# Patient Record
Sex: Female | Born: 1950 | Race: White | Hispanic: No | Smoking: Former smoker
Health system: Southern US, Community
[De-identification: ages and names within clinical notes are randomized; demographics above are authoritative.]

## PROBLEM LIST (undated history)

## (undated) DIAGNOSIS — R51 Headache: Secondary | ICD-10-CM

## (undated) DIAGNOSIS — R011 Cardiac murmur, unspecified: Secondary | ICD-10-CM

## (undated) DIAGNOSIS — F329 Major depressive disorder, single episode, unspecified: Secondary | ICD-10-CM

## (undated) DIAGNOSIS — K219 Gastro-esophageal reflux disease without esophagitis: Secondary | ICD-10-CM

## (undated) DIAGNOSIS — D649 Anemia, unspecified: Secondary | ICD-10-CM

## (undated) DIAGNOSIS — F419 Anxiety disorder, unspecified: Secondary | ICD-10-CM

## (undated) DIAGNOSIS — D259 Leiomyoma of uterus, unspecified: Secondary | ICD-10-CM

## (undated) DIAGNOSIS — I10 Essential (primary) hypertension: Secondary | ICD-10-CM

## (undated) DIAGNOSIS — F32A Depression, unspecified: Secondary | ICD-10-CM

## (undated) DIAGNOSIS — T4145XA Adverse effect of unspecified anesthetic, initial encounter: Secondary | ICD-10-CM

## (undated) DIAGNOSIS — M1990 Unspecified osteoarthritis, unspecified site: Secondary | ICD-10-CM

## (undated) DIAGNOSIS — T8859XA Other complications of anesthesia, initial encounter: Secondary | ICD-10-CM

## (undated) DIAGNOSIS — C4490 Unspecified malignant neoplasm of skin, unspecified: Secondary | ICD-10-CM

## (undated) DIAGNOSIS — R519 Headache, unspecified: Secondary | ICD-10-CM

## (undated) HISTORY — PX: BILATERAL OOPHORECTOMY: SHX1221

## (undated) HISTORY — PX: FACIAL COSMETIC SURGERY: SHX629

## (undated) HISTORY — PX: BACK SURGERY: SHX140

---

## 1972-01-08 HISTORY — PX: BILATERAL OOPHORECTOMY: SHX1221

## 2004-06-29 ENCOUNTER — Ambulatory Visit: Payer: Self-pay | Admitting: Obstetrics and Gynecology

## 2005-01-18 ENCOUNTER — Ambulatory Visit: Payer: Self-pay | Admitting: Internal Medicine

## 2006-01-23 ENCOUNTER — Ambulatory Visit: Payer: Self-pay | Admitting: Internal Medicine

## 2007-02-26 ENCOUNTER — Ambulatory Visit: Payer: Self-pay | Admitting: Internal Medicine

## 2007-12-14 ENCOUNTER — Ambulatory Visit: Payer: Self-pay | Admitting: Obstetrics and Gynecology

## 2008-03-24 ENCOUNTER — Ambulatory Visit: Payer: Self-pay | Admitting: Internal Medicine

## 2008-12-08 ENCOUNTER — Ambulatory Visit: Payer: Self-pay | Admitting: Internal Medicine

## 2009-01-07 HISTORY — PX: BREAST EXCISIONAL BIOPSY: SUR124

## 2009-03-31 ENCOUNTER — Ambulatory Visit: Payer: Self-pay | Admitting: Gastroenterology

## 2009-04-07 ENCOUNTER — Ambulatory Visit: Payer: Self-pay | Admitting: Gastroenterology

## 2009-04-21 ENCOUNTER — Ambulatory Visit: Payer: Self-pay | Admitting: Obstetrics and Gynecology

## 2009-11-09 ENCOUNTER — Ambulatory Visit: Payer: Self-pay | Admitting: Obstetrics and Gynecology

## 2010-01-16 ENCOUNTER — Ambulatory Visit: Payer: Self-pay

## 2010-01-22 ENCOUNTER — Ambulatory Visit: Payer: Self-pay | Admitting: Unknown Physician Specialty

## 2010-01-25 ENCOUNTER — Ambulatory Visit: Payer: Self-pay | Admitting: Unknown Physician Specialty

## 2010-05-31 ENCOUNTER — Ambulatory Visit: Payer: Self-pay | Admitting: Internal Medicine

## 2011-01-09 ENCOUNTER — Ambulatory Visit: Payer: Self-pay | Admitting: Unknown Physician Specialty

## 2011-06-04 ENCOUNTER — Ambulatory Visit: Payer: Self-pay | Admitting: Internal Medicine

## 2011-06-06 ENCOUNTER — Ambulatory Visit: Payer: Self-pay | Admitting: Internal Medicine

## 2012-07-14 ENCOUNTER — Ambulatory Visit: Payer: Self-pay | Admitting: Internal Medicine

## 2012-07-16 ENCOUNTER — Ambulatory Visit: Payer: Self-pay | Admitting: Internal Medicine

## 2012-07-29 ENCOUNTER — Ambulatory Visit: Payer: Self-pay | Admitting: Obstetrics and Gynecology

## 2012-07-29 LAB — BASIC METABOLIC PANEL
Anion Gap: 4 — ABNORMAL LOW (ref 7–16)
BUN: 15 mg/dL (ref 7–18)
Calcium, Total: 8.9 mg/dL (ref 8.5–10.1)
EGFR (Non-African Amer.): >60
Potassium: 3.8 mmol/L (ref 3.5–5.1)

## 2012-07-29 LAB — HEMOGLOBIN: HGB: 12.9 g/dL (ref 12.0–16.0)

## 2012-08-03 ENCOUNTER — Ambulatory Visit: Payer: Self-pay | Admitting: Obstetrics and Gynecology

## 2012-08-04 LAB — PATHOLOGY REPORT

## 2013-08-16 ENCOUNTER — Ambulatory Visit: Payer: Self-pay | Admitting: Internal Medicine

## 2014-04-29 NOTE — Op Note (Signed)
PATIENT NAME:  TIANI, STANBERY MR#:  [id number] DATE OF BIRTH:  1950-07-06  DATE OF PROCEDURE:  08/03/2012  PREOPERATIVE DIAGNOSIS:  1.  Postmenopausal bleeding.  2.  Large submucosal endometrial fibroid.   POSTOPERATIVE DIAGNOSIS:  1.  Postmenopausal bleeding.  2.  Large submucosal endometrial fibroid.   PROCEDURE: Resectoscopic removal of endometrial submucosal fibroid.   SURGEON: Boykin Nearing, M.D.   ANESTHESIA: General endotracheal anesthesia.  FIRST ASSISTANT: None.   INDICATION: This is a 64 year old, gravida 1, para 1 patient with postmenopausal bleeding. Saline infusion sonohysterography revealed a 3.6 x 2.4 x 3.6 cm endometrial fibroid. Endometrial biopsy performed in the office was negative for hyperplasia or cancer.   DESCRIPTION OF PROCEDURE: After adequate general endotracheal anesthesia, the patient was placed in the dorsal supine position with the legs in the candy cane stirrups. The patient did receive 2 grams IV cefoxitin prior to commencement of the case. Lower abdominal and vaginal perineal prep performed with Betadine. The patient's bladder was catheterized yielding 50 mL clear urine. Weighted speculum was placed into the posterior vaginal vault and the anterior cervix was grasped with a single-tooth tenaculum. Cervix was then dilated to #18 Hanks dilator without difficulty. The hysteroscope was then advanced into the endometrial cavity without difficulty. Evaluation of the endometrial cavity revealed a large fibroid at the 9 to 12 o'clock position. The hysteroscope was removed and the cervix was then dilated to a #20 Hanks dilator and the resectoscope was assembled and then advanced into the endometrial cavity, again with 1.5% Glycine used as the distending medium. The resectoscope was used to remove the submucosal fibroid. Multiple bits and pieces were retrieved. The fibroid was removed in its almost entirety. There was no bleeding from the fibroid bed. The  remnants of fiber were removed with Randall stone forceps. Pictures were taken. There were no complications. 3400 mL of 1.5% Glycine used with a net deficit of 330 mL. Some of this deficit was due to spillage onto the draping and to the floor. Estimated blood loss 10 mL. Intraoperative fluids 600 mL. The patient tolerated the procedure well and was taken to the recovery room in good condition.  ____________________________ Boykin Nearing, MD tjs:aw D: 08/03/2012 10:35:10 ET T: 08/03/2012 10:45:49 ET JOB#: [id number]  cc: Boykin Nearing, MD, <Dictator> Boykin Nearing MD ELECTRONICALLY SIGNED 08/07/2012 8:32

## 2014-08-11 ENCOUNTER — Other Ambulatory Visit: Payer: Self-pay | Admitting: Internal Medicine

## 2014-08-11 DIAGNOSIS — Z1231 Encounter for screening mammogram for malignant neoplasm of breast: Secondary | ICD-10-CM

## 2014-08-23 ENCOUNTER — Other Ambulatory Visit: Payer: Self-pay | Admitting: Internal Medicine

## 2014-08-23 DIAGNOSIS — Z1231 Encounter for screening mammogram for malignant neoplasm of breast: Secondary | ICD-10-CM

## 2014-08-24 ENCOUNTER — Ambulatory Visit
Admission: RE | Admit: 2014-08-24 | Discharge: 2014-08-24 | Disposition: A | Discharge status: Home / Self Care | Payer: BC Managed Care – PPO | Source: Ambulatory Visit | Attending: Internal Medicine | Admitting: Internal Medicine

## 2014-08-24 DIAGNOSIS — Z1231 Encounter for screening mammogram for malignant neoplasm of breast: Secondary | ICD-10-CM | POA: Insufficient documentation

## 2014-08-24 HISTORY — DX: Unspecified malignant neoplasm of skin, unspecified: C44.90

## 2015-05-04 ENCOUNTER — Other Ambulatory Visit: Payer: Self-pay | Admitting: Internal Medicine

## 2015-05-04 DIAGNOSIS — N6320 Unspecified lump in the left breast, unspecified quadrant: Secondary | ICD-10-CM

## 2015-05-24 ENCOUNTER — Ambulatory Visit
Admission: RE | Admit: 2015-05-24 | Discharge: 2015-05-24 | Disposition: A | Discharge status: Home / Self Care | Payer: Medicare Other | Source: Ambulatory Visit | Attending: Internal Medicine | Admitting: Internal Medicine

## 2015-05-24 ENCOUNTER — Other Ambulatory Visit: Payer: Self-pay | Admitting: Internal Medicine

## 2015-05-24 DIAGNOSIS — N6320 Unspecified lump in the left breast, unspecified quadrant: Secondary | ICD-10-CM

## 2015-05-24 DIAGNOSIS — N63 Unspecified lump in breast: Secondary | ICD-10-CM | POA: Diagnosis not present

## 2015-05-26 ENCOUNTER — Other Ambulatory Visit: Payer: Self-pay | Admitting: Internal Medicine

## 2015-05-26 DIAGNOSIS — N6320 Unspecified lump in the left breast, unspecified quadrant: Secondary | ICD-10-CM

## 2015-08-31 ENCOUNTER — Ambulatory Visit
Admission: RE | Admit: 2015-08-31 | Discharge: 2015-08-31 | Disposition: A | Discharge status: Home / Self Care | Payer: Medicare Other | Source: Ambulatory Visit | Attending: Internal Medicine | Admitting: Internal Medicine

## 2015-08-31 DIAGNOSIS — R928 Other abnormal and inconclusive findings on diagnostic imaging of breast: Secondary | ICD-10-CM | POA: Insufficient documentation

## 2015-08-31 DIAGNOSIS — N6320 Unspecified lump in the left breast, unspecified quadrant: Secondary | ICD-10-CM

## 2015-08-31 DIAGNOSIS — N63 Unspecified lump in breast: Secondary | ICD-10-CM | POA: Diagnosis present

## 2016-02-25 ENCOUNTER — Emergency Department: Payer: Medicare Other

## 2016-02-25 ENCOUNTER — Inpatient Hospital Stay
Admission: EM | Admit: 2016-02-25 | Discharge: 2016-02-28 | DRG: 494 | Disposition: A | Discharge status: Home Health | Payer: Medicare Other | Attending: Orthopedic Surgery | Admitting: Orthopedic Surgery

## 2016-02-25 ENCOUNTER — Encounter: Payer: Self-pay | Admitting: Emergency Medicine

## 2016-02-25 DIAGNOSIS — S82852A Displaced trimalleolar fracture of left lower leg, initial encounter for closed fracture: Secondary | ICD-10-CM | POA: Diagnosis present

## 2016-02-25 DIAGNOSIS — Z9889 Other specified postprocedural states: Secondary | ICD-10-CM

## 2016-02-25 DIAGNOSIS — W109XXA Fall (on) (from) unspecified stairs and steps, initial encounter: Secondary | ICD-10-CM | POA: Diagnosis present

## 2016-02-25 DIAGNOSIS — F329 Major depressive disorder, single episode, unspecified: Secondary | ICD-10-CM | POA: Diagnosis present

## 2016-02-25 DIAGNOSIS — Z7951 Long term (current) use of inhaled steroids: Secondary | ICD-10-CM

## 2016-02-25 DIAGNOSIS — Z881 Allergy status to other antibiotic agents status: Secondary | ICD-10-CM | POA: Diagnosis not present

## 2016-02-25 DIAGNOSIS — E7800 Pure hypercholesterolemia, unspecified: Secondary | ICD-10-CM | POA: Diagnosis present

## 2016-02-25 DIAGNOSIS — Z85828 Personal history of other malignant neoplasm of skin: Secondary | ICD-10-CM

## 2016-02-25 DIAGNOSIS — E785 Hyperlipidemia, unspecified: Secondary | ICD-10-CM | POA: Diagnosis present

## 2016-02-25 DIAGNOSIS — M25572 Pain in left ankle and joints of left foot: Secondary | ICD-10-CM | POA: Diagnosis present

## 2016-02-25 DIAGNOSIS — Z87891 Personal history of nicotine dependence: Secondary | ICD-10-CM | POA: Diagnosis not present

## 2016-02-25 DIAGNOSIS — Z01811 Encounter for preprocedural respiratory examination: Secondary | ICD-10-CM

## 2016-02-25 DIAGNOSIS — E876 Hypokalemia: Secondary | ICD-10-CM | POA: Diagnosis not present

## 2016-02-25 DIAGNOSIS — Z8781 Personal history of (healed) traumatic fracture: Secondary | ICD-10-CM

## 2016-02-25 DIAGNOSIS — F419 Anxiety disorder, unspecified: Secondary | ICD-10-CM | POA: Diagnosis present

## 2016-02-25 DIAGNOSIS — S82892A Other fracture of left lower leg, initial encounter for closed fracture: Secondary | ICD-10-CM | POA: Diagnosis present

## 2016-02-25 HISTORY — DX: Major depressive disorder, single episode, unspecified: F32.9

## 2016-02-25 HISTORY — DX: Depression, unspecified: F32.A

## 2016-02-25 LAB — COMPREHENSIVE METABOLIC PANEL
ALT: 13 U/L — ABNORMAL LOW (ref 14–54)
ANION GAP: 7 (ref 5–15)
AST: 19 U/L (ref 15–41)
Albumin: 4.5 g/dL (ref 3.5–5.0)
Alkaline Phosphatase: 55 U/L (ref 38–126)
BUN: 11 mg/dL (ref 6–20)
CHLORIDE: 104 mmol/L (ref 101–111)
CO2: 29 mmol/L (ref 22–32)
Calcium: 9 mg/dL (ref 8.9–10.3)
Creatinine, Ser: 0.8 mg/dL (ref 0.44–1.00)
Glucose, Bld: 92 mg/dL (ref 65–99)
POTASSIUM: 3.9 mmol/L (ref 3.5–5.1)
Sodium: 140 mmol/L (ref 135–145)
TOTAL PROTEIN: 7.2 g/dL (ref 6.5–8.1)
Total Bilirubin: 0.6 mg/dL (ref 0.3–1.2)

## 2016-02-25 LAB — CBC WITH DIFFERENTIAL/PLATELET
BASOS ABS: 0.1 10*3/uL (ref 0–0.1)
Basophils Relative: 1 %
EOS PCT: 0 %
Eosinophils Absolute: 0 10*3/uL (ref 0–0.7)
HCT: 43.1 % (ref 35.0–47.0)
Hemoglobin: 14.3 g/dL (ref 12.0–16.0)
LYMPHS PCT: 12 %
Lymphs Abs: 1.1 10*3/uL (ref 1.0–3.6)
MCH: 31.1 pg (ref 26.0–34.0)
MCHC: 33.2 g/dL (ref 32.0–36.0)
MCV: 93.7 fL (ref 80.0–100.0)
MONO ABS: 0.4 10*3/uL (ref 0.2–0.9)
Monocytes Relative: 5 %
Neutro Abs: 7.5 10*3/uL — ABNORMAL HIGH (ref 1.4–6.5)
Neutrophils Relative %: 82 %
PLATELETS: 279 10*3/uL (ref 150–440)
RBC: 4.6 MIL/uL (ref 3.80–5.20)
RDW: 13.2 % (ref 11.5–14.5)
WBC: 9.1 10*3/uL (ref 3.6–11.0)

## 2016-02-25 LAB — APTT: APTT: 27 s (ref 24–36)

## 2016-02-25 LAB — TYPE AND SCREEN
ABO/RH(D): A POS
ANTIBODY SCREEN: NEGATIVE

## 2016-02-25 LAB — PROTIME-INR
INR: 0.96
Prothrombin Time: 12.8 seconds (ref 11.4–15.2)

## 2016-02-25 MED ORDER — MORPHINE SULFATE (PF) 2 MG/ML IV SOLN
INTRAVENOUS | Status: AC
  Filled 2016-02-25: qty 1

## 2016-02-25 MED ORDER — METHOCARBAMOL 500 MG PO TABS: 500.0000 mg | ORAL_TABLET | Freq: Four times a day (QID) | ORAL | Status: DC | PRN

## 2016-02-25 MED ORDER — ONDANSETRON HCL 4 MG/2ML IJ SOLN
4.0000 mg | Freq: Once | INTRAMUSCULAR | Status: AC
  Administered 2016-02-25: 4 mg via INTRAVENOUS

## 2016-02-25 MED ORDER — OXYCODONE-ACETAMINOPHEN 5-325 MG PO TABS: 1.0000 | ORAL_TABLET | Freq: Once | ORAL | Status: DC

## 2016-02-25 MED ORDER — ONDANSETRON HCL 4 MG/2ML IJ SOLN
INTRAMUSCULAR | Status: AC
  Administered 2016-02-25: 4 mg via INTRAVENOUS
  Filled 2016-02-25: qty 2

## 2016-02-25 MED ORDER — MORPHINE SULFATE (PF) 2 MG/ML IV SOLN
2.0000 mg | Freq: Once | INTRAVENOUS | Status: AC
  Administered 2016-02-25: 2 mg via INTRAVENOUS

## 2016-02-25 MED ORDER — BUPIVACAINE HCL 0.25 % IJ SOLN
30.0000 mL | Freq: Once | INTRAMUSCULAR | Status: AC
  Administered 2016-02-25: 30 mL
  Filled 2016-02-25: qty 30

## 2016-02-25 MED ORDER — MORPHINE SULFATE (PF) 2 MG/ML IV SOLN
2.0000 mg | INTRAVENOUS | Status: DC | PRN
  Administered 2016-02-26 (×4): 2 mg via INTRAVENOUS
  Filled 2016-02-25 (×4): qty 1

## 2016-02-25 MED ORDER — METHOCARBAMOL 1000 MG/10ML IJ SOLN
500.0000 mg | Freq: Four times a day (QID) | INTRAVENOUS | Status: DC | PRN
  Filled 2016-02-25: qty 5

## 2016-02-25 MED ORDER — LIDOCAINE HCL 1 % IJ SOLN
10.0000 mL | Freq: Once | INTRAMUSCULAR | Status: AC
  Administered 2016-02-25: 10 mL
  Filled 2016-02-25: qty 10

## 2016-02-25 MED ORDER — SODIUM CHLORIDE 0.9 % IV SOLN
INTRAVENOUS | Status: DC
  Administered 2016-02-26: via INTRAVENOUS

## 2016-02-25 MED ORDER — BUPIVACAINE HCL (PF) 0.25 % IJ SOLN
INTRAMUSCULAR | Status: AC
  Administered 2016-02-25: 30 mL
  Filled 2016-02-25: qty 30

## 2016-02-25 MED ORDER — MORPHINE SULFATE (PF) 2 MG/ML IV SOLN: 2.0000 mg | Freq: Once | INTRAVENOUS | Status: DC

## 2016-02-25 MED ORDER — LIDOCAINE HCL (PF) 1 % IJ SOLN
INTRAMUSCULAR | Status: AC
  Administered 2016-02-25: 10 mL
  Filled 2016-02-25: qty 10

## 2016-02-25 MED ORDER — MORPHINE SULFATE (PF) 2 MG/ML IV SOLN
INTRAVENOUS | Status: AC
  Administered 2016-02-25: 2 mg via INTRAVENOUS
  Filled 2016-02-25: qty 1

## 2016-02-25 MED ORDER — OXYCODONE HCL 5 MG PO TABS: 5.0000 mg | ORAL_TABLET | ORAL | Status: DC | PRN

## 2016-02-25 NOTE — ED Provider Notes (Signed)
[facility] Emergency Department Provider Note  ____________________________________________  Time seen: Approximately 5:54 PM  I have reviewed the triage vital signs and the nursing notes.   HISTORY  Chief Complaint Ankle Pain and Fall    HPI Margaret Warner is a 66 y.o. female presenting to the emergency department with 10/10 aching/throbbing left ankle pain after falling while descending two stairs at her home. Patient states that her left foot inverted and "became caught" on the last step. Patient denies hitting her head or loss of consciousness during fall. She denies prior traumas or surgeries to the left lower extremity. Patient has been unable to bear weight on her left lower extremity since the incident. Patient denies chest pain, chest tightness, shortness of breath, abdominal pain, nausea and vomiting. No alleviating measures have been attempted.     Past Medical History:  Diagnosis Date  . Depression   . Skin cancer     Patient Active Problem List   Diagnosis Date Noted  . Fracture dislocation of ankle joint, left, closed, initial encounter 02/25/2016    Past Surgical History:  Procedure Laterality Date  . BACK SURGERY    . BREAST BIOPSY Left 2011   EXCISIONAL - NEG    Prior to Admission medications   Medication Sig Start Date End Date Taking? Authorizing Provider  ALPRAZolam Duanne Moron) 0.5 MG tablet Take 0.5 mg by mouth at bedtime as needed for anxiety.   Yes Historical Provider, MD  buPROPion (WELLBUTRIN XL) 300 MG 24 hr tablet Take 1 tablet by mouth daily. 01/28/16  Yes Historical Provider, MD  butalbital-acetaminophen-caffeine (FIORICET, ESGIC) 50-325-40 MG tablet Take 1 tablet by mouth as needed. 02/05/16  Yes Historical Provider, MD  COMBIPATCH 0.05-0.14 MG/DAY Place 1 patch onto the skin 2 (two) times a week.  02/04/16  Yes Historical Provider, MD  DULoxetine (CYMBALTA) 30 MG capsule Take 1 capsule by mouth daily. 12/14/15  Yes  Historical Provider, MD  fluticasone (FLONASE) 50 MCG/ACT nasal spray Place 2 sprays into both nostrils daily. 02/22/16  Yes Historical Provider, MD  gabapentin (NEURONTIN) 300 MG capsule Take 1 capsule by mouth 2 (two) times daily. 02/22/16  Yes Historical Provider, MD  HYDROcodone-acetaminophen (NORCO/VICODIN) 5-325 MG tablet Take 1 tablet by mouth every 4 (four) hours as needed. 02/23/16  Yes Historical Provider, MD  rosuvastatin (CRESTOR) 10 MG tablet Take 1 tablet by mouth daily. 12/25/15  Yes Historical Provider, MD  tiZANidine (ZANAFLEX) 4 MG tablet Take 1 tablet by mouth 3 (three) times daily. 02/22/16  Yes Historical Provider, MD  valACYclovir (VALTREX) 1000 MG tablet Take 1 tablet by mouth 3 (three) times daily. 12/25/15  Yes Historical Provider, MD    Allergies Tetracyclines & related  Family History  Problem Relation Age of Onset  . Breast cancer Neg Hx     Social History Social History  Substance Use Topics  . Smoking status: Former Smoker    Packs/day: 2.00    Years: 15.00    Types: Cigarettes    Quit date: 02/25/2004  . Smokeless tobacco: Never Used  . Alcohol use Yes     Comment: 1 drink weekly     Review of Systems  Constitutional: No fever/chills Eyes: No visual changes. No discharge ENT: No upper respiratory complaints. Cardiovascular: no chest pain. Respiratory: no cough. No SOB. Gastrointestinal: No abdominal pain. No nausea, no vomiting.  No diarrhea.  No constipation. Musculoskeletal: Patient has left ankle pain.  Skin: Negative for rash, abrasions, lacerations, ecchymosis. Neurological: Negative for  headaches, focal weakness or numbness. ___________________________________________   PHYSICAL EXAM:  VITAL SIGNS: ED Triage Vitals [02/25/16 1704]  Enc Vitals Group     BP (!) 145/87     Pulse Rate 64     Resp 20     Temp 97.7 F (36.5 C)     Temp Source Oral     SpO2 100 %     Weight 135 lb (61.2 kg)     Height 5\' 3"  (1.6 m)     Head  Circumference      Peak Flow      Pain Score 9     Pain Loc      Pain Edu?      Excl. in [place]?      Constitutional: Alert and oriented. Well appearing and in no acute distress. Hematological/Lymphatic/Immunilogical: No cervical lymphadenopathy. Cardiovascular: Normal rate, regular rhythm. Normal S1 and S2.  Good peripheral circulation. Respiratory: Normal respiratory effort without tachypnea or retractions. Lungs CTAB. Good air entry to the bases with no decreased or absent breath sounds. Musculoskeletal: Patient has 5 out of 5 strength in the lower extremities bilaterally. To inspection, left ankle appears in malalignment. Patient is unable to perform dorsiflexion or plantar flexion. She is able to move all five toes, left. Palpable dorsalis pedis pulse bilaterally and symmetrically. Neurologic:  Normal speech and language. No gross focal neurologic deficits are appreciated. Reflexes are 2+ and symmetric in the lower extremities bilaterally. Skin:  Patient has ecchymosis visualized at left ankle. Psychiatric: Mood and affect are normal. Speech and behavior are normal. Patient exhibits appropriate insight and judgement.   ____________________________________________   LABS (all labs ordered are listed, but only abnormal results are displayed)  Labs Reviewed  CBC WITH DIFFERENTIAL/PLATELET - Abnormal; Notable for the following:       Result Value   Neutro Abs 7.5 (*)    All other components within normal limits  COMPREHENSIVE METABOLIC PANEL - Abnormal; Notable for the following:    ALT 13 (*)    All other components within normal limits  URINE CULTURE  SURGICAL PCR SCREEN  PROTIME-INR  APTT  CBC  BASIC METABOLIC PANEL  TYPE AND SCREEN   ____________________________________________  EKG  Normal sinus rhythm without ST segment elevation. ____________________________________________  Hallock, personally viewed and evaluated these images (plain  radiographs) as part of my medical decision making, as well as reviewing the written report by the radiologist.  Dg Ankle Complete Left  Result Date: 02/25/2016 CLINICAL DATA:  Trimalleolar ankle fracture. EXAM: LEFT ANKLE COMPLETE - 3+ VIEW COMPARISON:  Earlier the same day. FINDINGS: Posterior dislocation of the talus is been reduced in the interval. There is persistent lateral subluxation of the talar dome relative to the distal tibia. Comminuted distal fibula fracture noted with medial malleolus fracture the distal tibia and posterior lip distal tibia fracture associated. IMPRESSION: Interval reduction of tibiotalar dislocation although lateral subluxation of the talus relative to the tibia persists. Electronically Signed   By: Misty Stanley M.D.   On: 02/25/2016 21:50   Dg Ankle Complete Left  Result Date: 02/25/2016 CLINICAL DATA:  Status post attempted reduction of left trimalleolar ankle fracture/dislocation. Initial encounter. EXAM: LEFT ANKLE COMPLETE - 3+ VIEW COMPARISON:  None. FINDINGS: There is improved alignment of the comminuted trimalleolar fracture of the left ankle. However, there is persistent posterior dislocation of the talus, lodged against the posterior malleolar fracture site. Significantly posteriorly displaced distal fibular and posterior malleolar fragments are again  noted, with a mildly displaced medial malleolar fracture. No new fractures are seen. Surrounding soft tissue swelling is noted. The subtalar joint is grossly unremarkable. IMPRESSION: Persistent posterior dislocation of the talus, lodged against the posterior malleolar fracture site. Significantly posteriorly displaced distal fibular and posterior malleolar fragments again noted, with a mildly displaced medial malleolar fracture. Alignment is somewhat improved from the prior study. These results were called by telephone at the time of interpretation on 02/25/2016 at 8:05 pm to Nursing at the [facility] ER, who  verbally acknowledged these results. Electronically Signed   By: Garald Balding M.D.   On: 02/25/2016 20:05   Dg Ankle Complete Left  Result Date: 02/25/2016 CLINICAL DATA:  Patient fell down stairs. Left ankle pain and deformity. EXAM: LEFT ANKLE COMPLETE - 3+ VIEW COMPARISON:  None. FINDINGS: Comminuted distal fibular fracture is associated with posterior lip fracture of the distal tibia and medial malleolar fracture. There is medial dislocation of the distal tibia relative to the talar dome. IMPRESSION: Comminuted trimalleolar fracture dislocation. Electronically Signed   By: Misty Stanley M.D.   On: 02/25/2016 17:28   Ct Ankle Left Wo Contrast  Result Date: 02/25/2016 CLINICAL DATA:  66 year old female with left ankle fracture dislocation. EXAM: CT OF THE LEFT ANKLE WITHOUT CONTRAST TECHNIQUE: Multidetector CT imaging of the left ankle was performed according to the standard protocol. Multiplanar CT image reconstructions were also generated. COMPARISON:  Left ankle radiographs dated 02/25/2016 FINDINGS: Bones/Joint/Cartilage There is a displaced and comminuted fracture of the distal fibula. There is extension of the fracture fragment into the lateral tibiotalar articulation. There is displaced corner fracture of the lateral aspect of the distal tibia at the talotibial articulation. There is fracture of the medial malleolus with approximately 5 mm lateral displacement of the distal fracture fragment. There is mildly displaced comminuted fracture of the posterior malleolus. No other acute fracture identified. The bones are osteopenic. There is approximately 5 mm lateral subluxation of the talus in relation to the tibia. Ligaments Suboptimally assessed by CT. Muscles and Tendons No significant intramuscular hematoma. No definite tendon injury by CT. The Achilles tendon is intact. Soft tissues There is diffuse subcutaneous soft tissue edema as well as edema and contusion of the deeper fascia layer. A 15 mm  rounded low attenuating intermuscular focus in the posterior compartment of the distal leg demonstrates fat attenuation. IMPRESSION: 1. Comminuted and displaced fracture of the distal fibula as well as fractures of the posterior and medial malleoli. There is also mildly displaced corner fracture of the lateral distal tibia at the talotibial articulation. 2. Approximately 5 mm lateral subluxation of the talus in relation to the tibia. Electronically Signed   By: Anner Crete M.D.   On: 02/25/2016 22:57   Ct 3d Recon At Scanner  Result Date: 02/25/2016 CLINICAL DATA:  66 year old female with left ankle fracture dislocation. EXAM: CT OF THE LEFT ANKLE WITHOUT CONTRAST TECHNIQUE: Multidetector CT imaging of the left ankle was performed according to the standard protocol. Multiplanar CT image reconstructions were also generated. COMPARISON:  Left ankle radiographs dated 02/25/2016 FINDINGS: Bones/Joint/Cartilage There is a displaced and comminuted fracture of the distal fibula. There is extension of the fracture fragment into the lateral tibiotalar articulation. There is displaced corner fracture of the lateral aspect of the distal tibia at the talotibial articulation. There is fracture of the medial malleolus with approximately 5 mm lateral displacement of the distal fracture fragment. There is mildly displaced comminuted fracture of the posterior malleolus. No other  acute fracture identified. The bones are osteopenic. There is approximately 5 mm lateral subluxation of the talus in relation to the tibia. Ligaments Suboptimally assessed by CT. Muscles and Tendons No significant intramuscular hematoma. No definite tendon injury by CT. The Achilles tendon is intact. Soft tissues There is diffuse subcutaneous soft tissue edema as well as edema and contusion of the deeper fascia layer. A 15 mm rounded low attenuating intermuscular focus in the posterior compartment of the distal leg demonstrates fat attenuation.  IMPRESSION: 1. Comminuted and displaced fracture of the distal fibula as well as fractures of the posterior and medial malleoli. There is also mildly displaced corner fracture of the lateral distal tibia at the talotibial articulation. 2. Approximately 5 mm lateral subluxation of the talus in relation to the tibia. Electronically Signed   By: Anner Crete M.D.   On: 02/25/2016 22:57    ____________________________________________    PROCEDURES  Procedure(s) performed:    Procedures    Medications  0.9 %  sodium chloride infusion ( Intravenous New Bag/Given 02/26/16 0016)  morphine 2 MG/ML injection 2 mg (not administered)  methocarbamol (ROBAXIN) tablet 500 mg (not administered)    Or  methocarbamol (ROBAXIN) 500 mg in dextrose 5 % 50 mL IVPB (not administered)  oxyCODONE (Oxy IR/ROXICODONE) immediate release tablet 5-10 mg (not administered)  ondansetron (ZOFRAN) injection 4 mg (4 mg Intravenous Given 02/25/16 1729)  morphine 2 MG/ML injection 2 mg (2 mg Intravenous Given 02/25/16 1729)  lidocaine (XYLOCAINE) 1 % (with pres) injection 10 mL (10 mLs Infiltration Given by Other 02/25/16 1952)  bupivacaine (MARCAINE) 0.25 % (with pres) injection 30 mL (30 mLs Infiltration Given by Other 02/25/16 1951)  morphine 2 MG/ML injection 2 mg (2 mg Intravenous Given 02/25/16 1813)  morphine 2 MG/ML injection 2 mg (2 mg Intravenous Given 02/25/16 2314)     ____________________________________________   INITIAL IMPRESSION / ASSESSMENT AND PLAN / ED COURSE  Pertinent labs & imaging results that were available during my care of the patient were reviewed by me and considered in my medical decision making (see chart for details).  Review of the [place] CSRS was performed in accordance of the [place] prior to dispensing any controlled drugs.     Assessment and Plan:  Closed fracture of left ankle, initial encounter.  Patient presents to the emergency department with left ankle pain after falling  down 2 stairs at her home. DG left ankle reveals a comminuted distal fibular fracture, with posterior lip fracture of the distal tibia and medial malleolar fracture. In addition, patient had trimalleolar fracture dislocation. Dr. Mack Guise, the orthopedist on-call, was consulted. Dr. Mack Guise performed patient's left ankle reduction in the emergency department. Patient's care was assumed by Dr. Mack Guise for transfer to ortho service, likely admission and surgical planning.     ____________________________________________  FINAL CLINICAL IMPRESSION(S) / ED DIAGNOSES  Final diagnoses:  Closed fracture of left ankle, initial encounter      NEW MEDICATIONS STARTED DURING THIS VISIT:  Current Discharge Medication List          This chart was dictated using voice recognition software/Dragon. Despite best efforts to proofread, errors can occur which can change the meaning. Any change was purely unintentional.    Lannie Fields, PA-C 02/26/16 [id number]    Nance Pear, MD 02/27/16 1154

## 2016-02-25 NOTE — ED Triage Notes (Signed)
Brought in via ems s/p fall  States she fell down 2 steps  Twisted left ankle  Positive deformity  Positive pulses

## 2016-02-25 NOTE — ED Provider Notes (Signed)
Apolonio Schneiders, attending physician, personally viewed and interpreted this EKG  EKG Time: 1944 Rate: 72 Rhythm: normal sinus rhythm Axis: normal Intervals: qtc 457 QRS: narrow ST changes: no st elevation, t wave inversion v1-v3 Impression: abnormal ekg    Nance Pear, MD 02/25/16 1946

## 2016-02-25 NOTE — H&P (Signed)
PREOPERATIVE H&P  Chief Complaint: Left ankle pain status post fall  HPI: Margaret Warner is a 66 y.o. female who presents to the [facility] regional emergency Department after sustaining an injury to her left ankle. 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 She was walking down a set of stairs.  Her right foot got caught on the stairs causing her left ankle to invert. She felt a pop and was unable to stand or bear weight following this injury.  She states that she has hypercholesterolemia which is being treated with statins by her primary care physician, Dr. Doy Hutching.  He is seen in emergency department with her friend, Ms. Baker, at the bedside.  Past Medical History:  Diagnosis Date  . Depression   . Skin cancer    Past Surgical History:  Procedure Laterality Date  . BREAST BIOPSY Left 2011   EXCISIONAL - NEG   Social History   Social History  . Marital status: Divorced    Spouse name: N/A  . Number of children: N/A  . Years of education: N/A   Social History Main Topics  . Smoking status: Former Research scientist (life sciences)  . Smokeless tobacco: Never Used  . Alcohol use Yes     Comment: 1 drink weekly  . Drug use: Unknown  . Sexual activity: Not Asked   Other Topics Concern  . None   Social History Narrative  . None   Family History  Problem Relation Age of Onset  . Breast cancer Neg Hx    Allergies  Allergen Reactions  . Tetracyclines & Related Rash   Prior to Admission medications   Medication Sig Start Date End Date Taking? Authorizing Provider  ALPRAZolam Duanne Moron) 0.5 MG tablet Take 0.5 mg by mouth at bedtime as needed for anxiety.   Yes Historical Provider, MD     Positive ROS: All other systems have been reviewed and were otherwise negative with the exception of those mentioned in the HPI and as above.  Physical Exam: General: Alert, no acute distress Cardiovascular: Regular rate and rhythm, no murmurs rubs or gallops.  No  pedal edema Respiratory: Clear to auscultation bilaterally, no wheezes rales or rhonchi. No cyanosis, no use of accessory musculature GI: No organomegaly, abdomen is soft and non-tender nondistended with positive bowel sounds. Skin: Skin intact, no lesions within the operative field. Neurologic: Sensation intact distally Psychiatric: Patient is competent for consent with normal mood and affect Lymphatic: No cervical lymphadenopathy  MUSCULOSKELETAL: Left ankle: Upon initial evaluation the patient's skin overlying the left ankle is intact. She has a valgus deformity of the left ankle. Foot is well perfused and she has palpable pedal pulses. She has intact sensation light touch throughout the left lower extremity. Her foot and ankle compartment are soft and compressible. She flex and extend her toes.  Assessment: Left ankle fracture dislocation  Plan: I explained to the patient that she has sustained a left ankle fracture dislocation.  I recommended a closed reduction medially in the ER. She agreed.  Patient was given an ankle block under sterile conditions with 10 cc of 1% lidocaine plain and 10 cc of half percent Marcaine plain. Injection was given over the anteromedial ankle. She tolerated this injection well. After slight 5 minutes, the ankle was gently reduced. She was placed in an AO splint. Her toes remain well perfused. She had intact sensation light touch in all 5 toes and can flex and extend her toes after the  reduction. Post reduction x-rays were ordered.  Splint to the patient that she will require surgical fixation for her injury. Her injury is scheduled for tomorrow. She will be cleared by the hospitalist service for surgery. I have placed orders for admission to the orthopedic service. Patient be nothing by mouth after midnight. Preoperative labs have been ordered.  I explained the details of the operation as well as the postoperative course. She understands that she'll be  nonweightbearing on the left lower extremity for 6-8 weeks postop. 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  I discussed the risks and benefits of surgery. The patient understood the risks include but are not limited to infection, bleeding, nerve or blood vessel injury especially injury to the superficial peroneal nerve leading to dorsal foot numbness, joint stiffness, osteoarthritis or loss of motion, persistent pain, weakness or instability, malunion, nonunion and hardware failure and the need for further surgery. Medical risks include but are not limited to DVT and pulmonary embolism, myocardial infarction, stroke, pneumonia, respiratory failure and death. Patient understood these risks and wished to proceed.   Patient will be added on to the surgical schedule for tomorrow. Surgery is pending medical clearance. I answered all the patient's questions.  Thornton Park, MD   02/25/2016 7:46 PM

## 2016-02-25 NOTE — ED Notes (Signed)
Pt assisted with bed pan by this tech

## 2016-02-26 ENCOUNTER — Inpatient Hospital Stay: Payer: Medicare Other | Admitting: Anesthesiology

## 2016-02-26 ENCOUNTER — Inpatient Hospital Stay: Payer: Medicare Other

## 2016-02-26 ENCOUNTER — Encounter
Admission: EM | Disposition: A | Discharge status: Home Health | Payer: Self-pay | Source: Home / Self Care | Attending: Orthopedic Surgery

## 2016-02-26 HISTORY — PX: ORIF ANKLE FRACTURE: SHX5408

## 2016-02-26 LAB — BASIC METABOLIC PANEL
ANION GAP: 6 (ref 5–15)
BUN: 13 mg/dL (ref 6–20)
CALCIUM: 8.5 mg/dL — AB (ref 8.9–10.3)
CO2: 29 mmol/L (ref 22–32)
CREATININE: 0.86 mg/dL (ref 0.44–1.00)
Chloride: 105 mmol/L (ref 101–111)
GLUCOSE: 98 mg/dL (ref 65–99)
Potassium: 3.6 mmol/L (ref 3.5–5.1)
Sodium: 140 mmol/L (ref 135–145)

## 2016-02-26 LAB — CBC
HEMATOCRIT: 38 % (ref 35.0–47.0)
Hemoglobin: 12.6 g/dL (ref 12.0–16.0)
MCH: 31.4 pg (ref 26.0–34.0)
MCHC: 33.2 g/dL (ref 32.0–36.0)
MCV: 94.4 fL (ref 80.0–100.0)
PLATELETS: 252 10*3/uL (ref 150–440)
RBC: 4.02 MIL/uL (ref 3.80–5.20)
RDW: 13.3 % (ref 11.5–14.5)
WBC: 8.6 10*3/uL (ref 3.6–11.0)

## 2016-02-26 LAB — SURGICAL PCR SCREEN
MRSA, PCR: NEGATIVE
Staphylococcus aureus: NEGATIVE

## 2016-02-26 SURGERY — OPEN REDUCTION INTERNAL FIXATION (ORIF) ANKLE FRACTURE
Anesthesia: General | Site: Ankle | Laterality: Left | Wound class: Clean

## 2016-02-26 MED ORDER — SENNA 8.6 MG PO TABS
1.0000 | ORAL_TABLET | Freq: Two times a day (BID) | ORAL | Status: DC
  Administered 2016-02-26 – 2016-02-28 (×4): 8.6 mg via ORAL
  Filled 2016-02-26 (×4): qty 1

## 2016-02-26 MED ORDER — EPHEDRINE 5 MG/ML INJ
INTRAVENOUS | Status: AC
  Filled 2016-02-26: qty 20

## 2016-02-26 MED ORDER — MORPHINE SULFATE (PF) 2 MG/ML IV SOLN
2.0000 mg | INTRAVENOUS | Status: DC | PRN
  Administered 2016-02-26: 2 mg via INTRAVENOUS
  Filled 2016-02-26: qty 1

## 2016-02-26 MED ORDER — FENTANYL CITRATE (PF) 100 MCG/2ML IJ SOLN
INTRAMUSCULAR | Status: DC | PRN
  Administered 2016-02-26: 50 ug via INTRAVENOUS
  Administered 2016-02-26: 25 ug via INTRAVENOUS
  Administered 2016-02-26 (×2): 50 ug via INTRAVENOUS
  Administered 2016-02-26: 25 ug via INTRAVENOUS

## 2016-02-26 MED ORDER — PROPOFOL 10 MG/ML IV BOLUS
INTRAVENOUS | Status: DC | PRN
  Administered 2016-02-26: 100 mg via INTRAVENOUS
  Administered 2016-02-26: 50 mg via INTRAVENOUS

## 2016-02-26 MED ORDER — ACETAMINOPHEN 650 MG RE SUPP: 650.0000 mg | Freq: Four times a day (QID) | RECTAL | Status: DC | PRN

## 2016-02-26 MED ORDER — SUGAMMADEX SODIUM 200 MG/2ML IV SOLN
INTRAVENOUS | Status: AC
Start: 2016-02-26 — End: 2016-02-26
  Filled 2016-02-26: qty 2

## 2016-02-26 MED ORDER — VALACYCLOVIR HCL 500 MG PO TABS
1000.0000 mg | ORAL_TABLET | Freq: Three times a day (TID) | ORAL | Status: DC
  Filled 2016-02-26 (×5): qty 2

## 2016-02-26 MED ORDER — OXYCODONE HCL 5 MG PO TABS: 5.0000 mg | ORAL_TABLET | Freq: Once | ORAL | Status: DC | PRN

## 2016-02-26 MED ORDER — DOCUSATE SODIUM 100 MG PO CAPS
100.0000 mg | ORAL_CAPSULE | Freq: Two times a day (BID) | ORAL | Status: DC
  Administered 2016-02-26 – 2016-02-28 (×4): 100 mg via ORAL
  Filled 2016-02-26 (×4): qty 1

## 2016-02-26 MED ORDER — ONDANSETRON HCL 4 MG PO TABS: 4.0000 mg | ORAL_TABLET | Freq: Four times a day (QID) | ORAL | Status: DC | PRN

## 2016-02-26 MED ORDER — BUPROPION HCL ER (XL) 300 MG PO TB24
300.0000 mg | ORAL_TABLET | Freq: Every day | ORAL | Status: DC
  Administered 2016-02-27 – 2016-02-28 (×2): 300 mg via ORAL
  Filled 2016-02-26 (×2): qty 1

## 2016-02-26 MED ORDER — TIZANIDINE HCL 4 MG PO TABS
4.0000 mg | ORAL_TABLET | Freq: Three times a day (TID) | ORAL | Status: DC
  Administered 2016-02-26 – 2016-02-28 (×5): 4 mg via ORAL
  Filled 2016-02-26 (×5): qty 1

## 2016-02-26 MED ORDER — FENTANYL CITRATE (PF) 100 MCG/2ML IJ SOLN
INTRAMUSCULAR | Status: AC
  Filled 2016-02-26: qty 2

## 2016-02-26 MED ORDER — MENTHOL 3 MG MT LOZG
1.0000 | LOZENGE | OROMUCOSAL | Status: DC | PRN
  Filled 2016-02-26: qty 9

## 2016-02-26 MED ORDER — ACETAMINOPHEN 10 MG/ML IV SOLN
INTRAVENOUS | Status: DC | PRN
  Administered 2016-02-26: 1000 mg via INTRAVENOUS

## 2016-02-26 MED ORDER — OXYCODONE HCL 5 MG/5ML PO SOLN: 5.0000 mg | Freq: Once | ORAL | Status: DC | PRN

## 2016-02-26 MED ORDER — CEFAZOLIN SODIUM-DEXTROSE 2-3 GM-% IV SOLR
INTRAVENOUS | Status: DC | PRN
  Administered 2016-02-26: 2 g via INTRAVENOUS

## 2016-02-26 MED ORDER — LIDOCAINE HCL (CARDIAC) 20 MG/ML IV SOLN
INTRAVENOUS | Status: DC | PRN
  Administered 2016-02-26: 60 mg via INTRAVENOUS

## 2016-02-26 MED ORDER — CEFAZOLIN SODIUM-DEXTROSE 2-3 GM-% IV SOLR
2.0000 g | Freq: Four times a day (QID) | INTRAVENOUS | Status: DC
  Administered 2016-02-26 – 2016-02-28 (×7): 2 g via INTRAVENOUS
  Filled 2016-02-26 (×10): qty 50

## 2016-02-26 MED ORDER — ONDANSETRON HCL 4 MG/2ML IJ SOLN
INTRAMUSCULAR | Status: DC | PRN
  Administered 2016-02-26: 4 mg via INTRAVENOUS

## 2016-02-26 MED ORDER — NEOMYCIN-POLYMYXIN B GU 40-200000 IR SOLN
Status: AC
  Filled 2016-02-26: qty 2

## 2016-02-26 MED ORDER — BISACODYL 10 MG RE SUPP: 10.0000 mg | Freq: Every day | RECTAL | Status: DC | PRN

## 2016-02-26 MED ORDER — KETOROLAC TROMETHAMINE 15 MG/ML IJ SOLN
15.0000 mg | Freq: Four times a day (QID) | INTRAMUSCULAR | Status: AC
  Administered 2016-02-26 – 2016-02-27 (×5): 15 mg via INTRAVENOUS
  Filled 2016-02-26 (×5): qty 1

## 2016-02-26 MED ORDER — SODIUM CHLORIDE 0.9 % IV SOLN
75.0000 mL/h | INTRAVENOUS | Status: DC
  Administered 2016-02-26 – 2016-02-27 (×2): 75 mL/h via INTRAVENOUS

## 2016-02-26 MED ORDER — ONDANSETRON HCL 4 MG/2ML IJ SOLN
INTRAMUSCULAR | Status: AC
  Filled 2016-02-26: qty 2

## 2016-02-26 MED ORDER — DULOXETINE HCL 30 MG PO CPEP
30.0000 mg | ORAL_CAPSULE | Freq: Every day | ORAL | Status: DC
  Administered 2016-02-27 – 2016-02-28 (×2): 30 mg via ORAL
  Filled 2016-02-26 (×2): qty 1

## 2016-02-26 MED ORDER — METHOCARBAMOL 500 MG PO TABS: 500.0000 mg | ORAL_TABLET | Freq: Four times a day (QID) | ORAL | Status: DC | PRN

## 2016-02-26 MED ORDER — ALPRAZOLAM 0.5 MG PO TABS: 0.5000 mg | ORAL_TABLET | Freq: Every evening | ORAL | Status: DC | PRN

## 2016-02-26 MED ORDER — BUPIVACAINE HCL (PF) 0.25 % IJ SOLN
INTRAMUSCULAR | Status: AC
  Filled 2016-02-26: qty 30

## 2016-02-26 MED ORDER — CEFAZOLIN SODIUM-DEXTROSE 2-4 GM/100ML-% IV SOLN
2.0000 g | Freq: Four times a day (QID) | INTRAVENOUS | Status: DC
  Filled 2016-02-26 (×2): qty 100

## 2016-02-26 MED ORDER — ROCURONIUM BROMIDE 50 MG/5ML IV SOLN
INTRAVENOUS | Status: AC
  Filled 2016-02-26: qty 1

## 2016-02-26 MED ORDER — FENTANYL CITRATE (PF) 100 MCG/2ML IJ SOLN
25.0000 ug | INTRAMUSCULAR | Status: DC | PRN
  Administered 2016-02-26: 50 ug via INTRAVENOUS

## 2016-02-26 MED ORDER — ONDANSETRON HCL 4 MG/2ML IJ SOLN
4.0000 mg | Freq: Four times a day (QID) | INTRAMUSCULAR | Status: DC | PRN
  Administered 2016-02-26: 4 mg via INTRAVENOUS
  Filled 2016-02-26: qty 2

## 2016-02-26 MED ORDER — CEFAZOLIN SODIUM 1 G IJ SOLR
INTRAMUSCULAR | Status: AC
  Filled 2016-02-26: qty 10

## 2016-02-26 MED ORDER — SEVOFLURANE IN SOLN
RESPIRATORY_TRACT | Status: AC
  Filled 2016-02-26: qty 250

## 2016-02-26 MED ORDER — LACTATED RINGERS IV SOLN
INTRAVENOUS | Status: DC | PRN
  Administered 2016-02-26 (×2): via INTRAVENOUS

## 2016-02-26 MED ORDER — DEXAMETHASONE SODIUM PHOSPHATE 10 MG/ML IJ SOLN
INTRAMUSCULAR | Status: DC | PRN
  Administered 2016-02-26: 5 mg via INTRAVENOUS

## 2016-02-26 MED ORDER — PHENOL 1.4 % MT LIQD
1.0000 | OROMUCOSAL | Status: DC | PRN
  Filled 2016-02-26: qty 177

## 2016-02-26 MED ORDER — MIDAZOLAM HCL 2 MG/2ML IJ SOLN
INTRAMUSCULAR | Status: AC
  Filled 2016-02-26: qty 2

## 2016-02-26 MED ORDER — POLYETHYLENE GLYCOL 3350 17 G PO PACK: 17.0000 g | PACK | Freq: Every day | ORAL | Status: DC | PRN

## 2016-02-26 MED ORDER — PHENYLEPHRINE HCL 10 MG/ML IJ SOLN
INTRAMUSCULAR | Status: DC | PRN
  Administered 2016-02-26 (×2): 100 ug via INTRAVENOUS

## 2016-02-26 MED ORDER — PROPOFOL 10 MG/ML IV BOLUS
INTRAVENOUS | Status: AC
  Filled 2016-02-26: qty 20

## 2016-02-26 MED ORDER — METHOCARBAMOL 1000 MG/10ML IJ SOLN
500.0000 mg | Freq: Four times a day (QID) | INTRAVENOUS | Status: DC | PRN
  Filled 2016-02-26: qty 5

## 2016-02-26 MED ORDER — ALUM & MAG HYDROXIDE-SIMETH 200-200-20 MG/5ML PO SUSP: 30.0000 mL | ORAL | Status: DC | PRN

## 2016-02-26 MED ORDER — EPHEDRINE SULFATE 50 MG/ML IJ SOLN
INTRAMUSCULAR | Status: DC | PRN
  Administered 2016-02-26: 10 mg via INTRAVENOUS
  Administered 2016-02-26 (×2): 15 mg via INTRAVENOUS
  Administered 2016-02-26: 10 mg via INTRAVENOUS
  Administered 2016-02-26: 15 mg via INTRAVENOUS
  Administered 2016-02-26: 10 mg via INTRAVENOUS

## 2016-02-26 MED ORDER — ESTRADIOL-NORETHINDRONE ACET 0.05-0.14 MG/DAY TD PTTW: 1.0000 | MEDICATED_PATCH | TRANSDERMAL | Status: DC

## 2016-02-26 MED ORDER — ACETAMINOPHEN 10 MG/ML IV SOLN
INTRAVENOUS | Status: AC
  Filled 2016-02-26: qty 100

## 2016-02-26 MED ORDER — LIDOCAINE HCL (PF) 2 % IJ SOLN
INTRAMUSCULAR | Status: AC
  Filled 2016-02-26: qty 2

## 2016-02-26 MED ORDER — MIDAZOLAM HCL 2 MG/2ML IJ SOLN
INTRAMUSCULAR | Status: DC | PRN
  Administered 2016-02-26: 2 mg via INTRAVENOUS

## 2016-02-26 MED ORDER — BUTALBITAL-APAP-CAFFEINE 50-325-40 MG PO TABS: 1.0000 | ORAL_TABLET | Freq: Four times a day (QID) | ORAL | Status: DC | PRN

## 2016-02-26 MED ORDER — ROSUVASTATIN CALCIUM 10 MG PO TABS
10.0000 mg | ORAL_TABLET | Freq: Every day | ORAL | Status: DC
  Administered 2016-02-26 – 2016-02-28 (×2): 10 mg via ORAL
  Filled 2016-02-26 (×3): qty 1

## 2016-02-26 MED ORDER — KETOROLAC TROMETHAMINE 30 MG/ML IJ SOLN
INTRAMUSCULAR | Status: DC | PRN
  Administered 2016-02-26: 30 mg via INTRAVENOUS

## 2016-02-26 MED ORDER — GABAPENTIN 300 MG PO CAPS
300.0000 mg | ORAL_CAPSULE | Freq: Two times a day (BID) | ORAL | Status: DC
  Administered 2016-02-26 – 2016-02-28 (×4): 300 mg via ORAL
  Filled 2016-02-26 (×4): qty 1

## 2016-02-26 MED ORDER — KETOROLAC TROMETHAMINE 30 MG/ML IJ SOLN
INTRAMUSCULAR | Status: AC
  Filled 2016-02-26: qty 1

## 2016-02-26 MED ORDER — ROCURONIUM BROMIDE 100 MG/10ML IV SOLN
INTRAVENOUS | Status: DC | PRN
  Administered 2016-02-26: 30 mg via INTRAVENOUS

## 2016-02-26 MED ORDER — ENOXAPARIN SODIUM 40 MG/0.4ML ~~LOC~~ SOLN
40.0000 mg | SUBCUTANEOUS | Status: DC
  Administered 2016-02-27 – 2016-02-28 (×2): 40 mg via SUBCUTANEOUS
  Filled 2016-02-26 (×2): qty 0.4

## 2016-02-26 MED ORDER — MIDAZOLAM HCL 2 MG/2ML IJ SOLN
INTRAMUSCULAR | Status: AC
  Administered 2016-02-26: 1 mg via INTRAVENOUS
  Filled 2016-02-26: qty 2

## 2016-02-26 MED ORDER — OXYCODONE HCL 5 MG PO TABS
5.0000 mg | ORAL_TABLET | ORAL | Status: DC | PRN
  Administered 2016-02-26 – 2016-02-27 (×3): 5 mg via ORAL
  Administered 2016-02-27 (×2): 10 mg via ORAL
  Administered 2016-02-28: 5 mg via ORAL
  Filled 2016-02-26: qty 1
  Filled 2016-02-26 (×2): qty 2
  Filled 2016-02-26 (×3): qty 1

## 2016-02-26 MED ORDER — NEOMYCIN-POLYMYXIN B GU 40-200000 IR SOLN
Status: DC | PRN
  Administered 2016-02-26: 2 mL

## 2016-02-26 MED ORDER — FLUTICASONE PROPIONATE 50 MCG/ACT NA SUSP
2.0000 | Freq: Every day | NASAL | Status: DC
  Administered 2016-02-27 – 2016-02-28 (×2): 2 via NASAL
  Filled 2016-02-26: qty 16

## 2016-02-26 MED ORDER — MIDAZOLAM HCL 2 MG/2ML IJ SOLN
1.0000 mg | Freq: Once | INTRAMUSCULAR | Status: AC
  Administered 2016-02-26: 1 mg via INTRAVENOUS

## 2016-02-26 MED ORDER — DEXAMETHASONE SODIUM PHOSPHATE 10 MG/ML IJ SOLN
INTRAMUSCULAR | Status: AC
  Filled 2016-02-26: qty 1

## 2016-02-26 MED ORDER — SUGAMMADEX SODIUM 200 MG/2ML IV SOLN
INTRAVENOUS | Status: DC | PRN
  Administered 2016-02-26: 150 mg via INTRAVENOUS

## 2016-02-26 MED ORDER — ACETAMINOPHEN 325 MG PO TABS
650.0000 mg | ORAL_TABLET | Freq: Four times a day (QID) | ORAL | Status: DC | PRN
  Administered 2016-02-26 – 2016-02-28 (×6): 650 mg via ORAL
  Filled 2016-02-26 (×6): qty 2

## 2016-02-26 MED ORDER — MAGNESIUM CITRATE PO SOLN
1.0000 | Freq: Once | ORAL | Status: DC | PRN
Start: 2016-02-26 — End: 2016-02-28
  Filled 2016-02-26: qty 296

## 2016-02-26 SURGICAL SUPPLY — 62 items
BANDAGE ELASTIC 4 LF NS (GAUZE/BANDAGES/DRESSINGS) ×6 IMPLANT
BANDAGE ELASTIC 6 LF NS (GAUZE/BANDAGES/DRESSINGS) ×3 IMPLANT
BIT DRILL 2.5X110 QC LCP DISP (BIT) ×3 IMPLANT
BIT DRILL CANN 2.7X625 NONSTRL (BIT) ×3 IMPLANT
BLADE SURG 15 STRL LF DISP TIS (BLADE) ×1 IMPLANT
BLADE SURG 15 STRL SS (BLADE) ×2
BNDG ESMARK 6X12 TAN STRL LF (GAUZE/BANDAGES/DRESSINGS) ×3 IMPLANT
CLOSURE WOUND 1/2 X4 (GAUZE/BANDAGES/DRESSINGS) ×1
CUFF TOURN 24 STER (MISCELLANEOUS) ×3 IMPLANT
CUFF TOURN 30 STER DUAL PORT (MISCELLANEOUS) IMPLANT
DRAPE FLUOR MINI C-ARM 54X84 (DRAPES) ×3 IMPLANT
DRAPE INCISE IOBAN 66X45 STRL (DRAPES) ×3 IMPLANT
DRAPE U-SHAPE 47X51 STRL (DRAPES) ×3 IMPLANT
DURAPREP 26ML APPLICATOR (WOUND CARE) ×6 IMPLANT
GAUZE PETRO XEROFOAM 1X8 (MISCELLANEOUS) ×3 IMPLANT
GAUZE SPONGE 4X4 12PLY STRL (GAUZE/BANDAGES/DRESSINGS) ×3 IMPLANT
GAUZE XEROFORM 4X4 STRL (GAUZE/BANDAGES/DRESSINGS) ×3 IMPLANT
GLOVE BIOGEL PI IND STRL 9 (GLOVE) ×1 IMPLANT
GLOVE BIOGEL PI INDICATOR 9 (GLOVE) ×2
GLOVE SURG 9.0 ORTHO LTXF (GLOVE) ×6 IMPLANT
GOWN STRL REUS TWL 2XL XL LVL4 (GOWN DISPOSABLE) ×3 IMPLANT
GOWN STRL REUS W/ TWL LRG LVL3 (GOWN DISPOSABLE) ×1 IMPLANT
GOWN STRL REUS W/TWL LRG LVL3 (GOWN DISPOSABLE) ×2
INACTIVE NO USAGE (WIRE) ×6 IMPLANT
KIT RM TURNOVER STRD PROC AR (KITS) ×3 IMPLANT
KWIRE ×6 IMPLANT
LABEL OR SOLS (LABEL) ×3 IMPLANT
NS IRRIG 1000ML POUR BTL (IV SOLUTION) ×3 IMPLANT
NS IRRIG 500ML POUR BTL (IV SOLUTION) ×3 IMPLANT
PACK EXTREMITY ARMC (MISCELLANEOUS) ×3 IMPLANT
PAD ABD DERMACEA PRESS 5X9 (GAUZE/BANDAGES/DRESSINGS) ×3 IMPLANT
PAD CAST CTTN 4X4 STRL (SOFTGOODS) ×1 IMPLANT
PADDING CAST COTTON 4X4 STRL (SOFTGOODS) ×2
PLATE LCP 3.5 1/3 TUB 10HX117 (Plate) ×3 IMPLANT
SCREW CANC FT ST SFS 4X16 (Screw) ×3 IMPLANT
SCREW CANC FT/18 4.0 (Screw) ×6 IMPLANT
SCREW CANN L THRD/26 4.0 (Screw) ×3 IMPLANT
SCREW CANN L THRD/36 4.0 (Screw) ×3 IMPLANT
SCREW CANN L THRD/40 4.0 (Screw) ×6 IMPLANT
SCREW CANN L THRD/42 4.0 (Screw) ×3 IMPLANT
SCREW CORTEX 3.5 10MM (Screw) ×2 IMPLANT
SCREW CORTEX 3.5 12MM (Screw) ×8 IMPLANT
SCREW CORTEX 3.5 18MM (Screw) ×4 IMPLANT
SCREW CORTEX 3.5 20MM (Screw) ×2 IMPLANT
SCREW CORTEX 3.5 28MM (Screw) ×2 IMPLANT
SCREW LOCK CORT ST 3.5X10 (Screw) ×1 IMPLANT
SCREW LOCK CORT ST 3.5X12 (Screw) ×4 IMPLANT
SCREW LOCK CORT ST 3.5X18 (Screw) ×2 IMPLANT
SCREW LOCK CORT ST 3.5X20 (Screw) ×1 IMPLANT
SCREW LOCK CORT ST 3.5X28 (Screw) ×1 IMPLANT
SPLINT CAST 1 STEP 4X30 (MISCELLANEOUS) ×6 IMPLANT
SPONGE LAP 18X18 5 PK (GAUZE/BANDAGES/DRESSINGS) ×3 IMPLANT
STAPLER SKIN PROX 35W (STAPLE) ×3 IMPLANT
STOCKINETTE STRL 6IN 960660 (GAUZE/BANDAGES/DRESSINGS) ×3 IMPLANT
STRIP CLOSURE SKIN 1/2X4 (GAUZE/BANDAGES/DRESSINGS) ×2 IMPLANT
SUT VIC AB 0 SH 27 (SUTURE) ×3 IMPLANT
SUT VIC AB 2-0 SH 27 (SUTURE) ×6
SUT VIC AB 2-0 SH 27XBRD (SUTURE) ×3 IMPLANT
SYR 30ML LL (SYRINGE) ×3 IMPLANT
TAPE MICROPORE 2IN (TAPE) ×3 IMPLANT
WATER STERILE IRR 500ML POUR (IV SOLUTION) ×3 IMPLANT
kwire ×3 IMPLANT

## 2016-02-26 NOTE — NC FL2 (Signed)
[place] LEVEL OF CARE SCREENING TOOL     IDENTIFICATION  Patient Name: Margaret Warner [facility] Birthdate: 13-Jan-1950 Sex: female Admission Date (Current Location): 02/25/2016  [place] and [place] Number:  Engineering geologist and Address:  [facility], [address] [place], [place] [address]      Provider Number: [id number]  Attending Physician Name and Address:  Thornton Park, MD  Relative Name and Phone Number:       Current Level of Care: Hospital Recommended Level of Care: [place] Prior Approval Number:    Date Approved/Denied:   PASRR Number:  ([id number])  Discharge Plan: SNF    Current Diagnoses: Patient Active Problem List   Diagnosis Date Noted  . Fracture dislocation of ankle joint, left, closed, initial encounter 02/25/2016    Orientation RESPIRATION BLADDER Height & Weight     Self, Time, Situation, Place  Normal Continent Weight: 139 lb 6.4 oz (63.2 kg) Height:  5\' 3"  (160 cm)  BEHAVIORAL SYMPTOMS/MOOD NEUROLOGICAL BOWEL NUTRITION STATUS   (none)  (none) Continent Diet (NPO for surgery )  AMBULATORY STATUS COMMUNICATION OF NEEDS Skin   Extensive Assist Verbally Surgical wounds                       Personal Care Assistance Level of Assistance  Bathing, Feeding, Dressing Bathing Assistance: Limited assistance Feeding assistance: Independent Dressing Assistance: Limited assistance     Functional Limitations Info  Sight, Hearing, Speech Sight Info: Adequate Hearing Info: Adequate Speech Info: Adequate    SPECIAL CARE FACTORS FREQUENCY  PT (By licensed PT), OT (By licensed OT)     PT Frequency:  (5) OT Frequency:  (5)            Contractures      Additional Factors Info  Code Status, Allergies Code Status Info:  (Full Code. ) Allergies Info:  (Tetracyclines & Related)           Current Medications (02/26/2016):  This is the current hospital active medication  list Current Facility-Administered Medications  Medication Dose Route Frequency Provider Last Rate Last Dose  . 0.9 %  sodium chloride infusion   Intravenous Continuous Thornton Park, MD 75 mL/hr at 02/26/16 0016    . methocarbamol (ROBAXIN) tablet 500 mg  500 mg Oral Q6H PRN Thornton Park, MD       Or  . methocarbamol (ROBAXIN) 500 mg in dextrose 5 % 50 mL IVPB  500 mg Intravenous Q6H PRN Thornton Park, MD      . morphine 2 MG/ML injection 2 mg  2 mg Intravenous Q2H PRN Thornton Park, MD   2 mg at 02/26/16 1100  . neomycin-polymyxin B (NEOSPORIN) irrigation solution    PRN Thornton Park, MD   2 mL at 02/26/16 1356  . oxyCODONE (Oxy IR/ROXICODONE) immediate release tablet 5-10 mg  5-10 mg Oral Q4H PRN Thornton Park, MD       Facility-Administered Medications Ordered in Other Encounters  Medication Dose Route Frequency Provider Last Rate Last Dose  . ceFAZolin (ANCEF) IVPB 2 g/50 mL premix   Intravenous Anesthesia Intra-op Justus Memory, CRNA   2 g at 02/26/16 1316  . dexamethasone (DECADRON) injection   Intravenous Anesthesia Intra-op Doreen Salvage, CRNA   5 mg at 02/26/16 1258  . ePHEDrine injection   Intravenous Anesthesia Intra-op Doreen Salvage, CRNA   15 mg at 02/26/16 1317  . fentaNYL (SUBLIMAZE) injection    Anesthesia Intra-op Doreen Salvage,  CRNA   50 mcg at 02/26/16 1310  . lactated ringers infusion    Continuous PRN Doreen Salvage, CRNA      . lidocaine (cardiac) 100 mg/4ml (XYLOCAINE) 20 MG/ML injection 2%   Intravenous Anesthesia Intra-op Doreen Salvage, CRNA   60 mg at 02/26/16 1245  . midazolam (VERSED) injection    Anesthesia Intra-op Doreen Salvage, CRNA   2 mg at 02/26/16 1235  . ondansetron (ZOFRAN) injection   Intravenous Anesthesia Intra-op Justus Memory, CRNA   4 mg at 02/26/16 1431  . phenylephrine (NEO-SYNEPHRINE) injection   Intravenous Anesthesia Intra-op Justus Memory, CRNA   100 mcg at 02/26/16 1406  . propofol (DIPRIVAN) 10 mg/mL bolus/IV push     Anesthesia Intra-op Doreen Salvage, CRNA   50 mg at 02/26/16 1308  . rocuronium [facility]) injection    Anesthesia Intra-op Doreen Salvage, CRNA   30 mg at 02/26/16 1245     Discharge Medications: Please see discharge summary for a list of discharge medications.  Relevant Imaging Results:  Relevant Lab Results:   Additional Information  (SSN: [id number])  Mildreth Reek, Veronia Beets, LCSW

## 2016-02-26 NOTE — Consult Note (Signed)
Medical Consultation  Chakia Iveson Adamek [id number] DOB: 12-16-1950 DOA: 02/25/2016 PCP: Idelle Crouch, MD   Requesting physician: Dr Octavio Manns Date of consultation: 02/26/2016 Reason for consultation: medical clearaince  Impression/Recommendations  66 y/o female with history of depression and hyperlipidemia who presents after a mechanical fall and now has suffered a left ankle fracture dislocation.  1. Preoperative clearance for left ankle fracture: Patient is low risk for moderate risk procedure. Patient may proceed without further cardiac evaluation. DVT prophylaxis as per orthopedic surgery 2. Hyperlipidemia: Continue statin  3. Depression: Continue outpatient medications    Chief Complaint:  Left ankle pain  HPI:  66 year old female with history of depression and hyperlipidemia who presents after a fall and sustained a left ankle fracture dislocation. Pain is currently 6/10. She denies chest pain, dizziness, shortness of breath. She has been in her usual state of health. She is fairly active.  Review of Systems  Constitutional: Negative for fever, chills weight loss HENT: Negative for ear pain, nosebleeds, congestion, facial swelling, rhinorrhea, neck pain, neck stiffness and ear discharge.   Respiratory: Negative for cough, shortness of breath, wheezing  Cardiovascular: Negative for chest pain, palpitations and leg swelling.  Gastrointestinal: Negative for heartburn, abdominal pain, vomiting, diarrhea or consitpation Genitourinary: Negative for dysuria, urgency, frequency, hematuria Musculoskeletal: Negative for back pain or joint pain Neurological: Negative for dizziness, seizures, syncope, focal weakness,  numbness and headaches.  Hematological: Does not bruise/bleed easily.  Psychiatric/Behavioral: Negative for hallucinations, confusion, positive for depression  Past Medical History:  Diagnosis Date  . Depression   . Skin cancer    Past Surgical History:   Procedure Laterality Date  . BACK SURGERY    . BREAST BIOPSY Left 2011   EXCISIONAL - NEG   Social History:  reports that she quit smoking about 12 years ago. Her smoking use included Cigarettes. She has a 30.00 pack-year smoking history. She has never used smokeless tobacco. She reports that she drinks alcohol. She reports that she does not use drugs.  Allergies  Allergen Reactions  . Tetracyclines & Related Rash   Family History  Problem Relation Age of Onset  . Breast cancer Neg Hx     Prior to Admission medications   Medication Sig Start Date End Date Taking? Authorizing Provider  ALPRAZolam Duanne Moron) 0.5 MG tablet Take 0.5 mg by mouth at bedtime as needed for anxiety.   Yes Historical Provider, MD  buPROPion (WELLBUTRIN XL) 300 MG 24 hr tablet Take 1 tablet by mouth daily. 01/28/16  Yes Historical Provider, MD  butalbital-acetaminophen-caffeine (FIORICET, ESGIC) 50-325-40 MG tablet Take 1 tablet by mouth as needed. 02/05/16  Yes Historical Provider, MD  COMBIPATCH 0.05-0.14 MG/DAY Place 1 patch onto the skin 2 (two) times a week.  02/04/16  Yes Historical Provider, MD  DULoxetine (CYMBALTA) 30 MG capsule Take 1 capsule by mouth daily. 12/14/15  Yes Historical Provider, MD  fluticasone (FLONASE) 50 MCG/ACT nasal spray Place 2 sprays into both nostrils daily. 02/22/16  Yes Historical Provider, MD  gabapentin (NEURONTIN) 300 MG capsule Take 1 capsule by mouth 2 (two) times daily. 02/22/16  Yes Historical Provider, MD  HYDROcodone-acetaminophen (NORCO/VICODIN) 5-325 MG tablet Take 1 tablet by mouth every 4 (four) hours as needed. 02/23/16  Yes Historical Provider, MD  rosuvastatin (CRESTOR) 10 MG tablet Take 1 tablet by mouth daily. 12/25/15  Yes Historical Provider, MD  tiZANidine (ZANAFLEX) 4 MG tablet Take 1 tablet by mouth 3 (three) times daily. 02/22/16  Yes Historical Provider, MD  valACYclovir Estell Harpin)  1000 MG tablet Take 1 tablet by mouth 3 (three) times daily. 12/25/15  Yes Historical  Provider, MD    Physical Exam: Blood pressure 136/67, pulse (!) 59, temperature 98.1 F (36.7 C), temperature source Oral, resp. rate 16, height 5\' 3"  (1.6 m), weight 63.2 kg (139 lb 6.4 oz), SpO2 97 %. @VITALS2 @ Filed Weights   02/25/16 1704 02/25/16 2351  Weight: 61.2 kg (135 lb) 63.2 kg (139 lb 6.4 oz)    Intake/Output Summary (Last 24 hours) at 02/26/16 0736 Last data filed at 02/26/16 0500  Gross per 24 hour  Intake              355 ml  Output              350 ml  Net                5 ml     Constitutional: Appears well-developed and well-nourished. No distress. HENT: Normocephalic. Marland Kitchen Oropharynx is clear and moist.  Eyes: Conjunctivae and EOM are normal. PERRLA, no scleral icterus.  Neck: Normal ROM. Neck supple. No JVD. No tracheal deviation. CVS: RRR, S1/S2 +, no murmurs, no gallops, no carotid bruit.  Pulmonary: Effort and breath sounds normal, no stridor, rhonchi, wheezes, rales.  Abdominal: Soft. BS +,  no distension, tenderness, rebound or guarding.  Musculoskeletal: valgus deformity left ankle Neuro: Alert. CN 2-12 grossly intact. No focal deficits. Skin: Skin is warm and dry. No rash noted. Psychiatric: Normal mood and affect.    Labs  Basic Metabolic Panel:  Recent Labs Lab 02/26/16 0347  NA 140  K 3.6  CL 105  CO2 29  GLUCOSE 98  BUN 13  CREATININE 0.86  CALCIUM 8.5*   Liver Function Tests:  Recent Labs Lab 02/25/16 1930  AST 19  ALT 13*  ALKPHOS 55  BILITOT 0.6  PROT 7.2  ALBUMIN 4.5   No results for input(s): LIPASE, AMYLASE in the last 168 hours.  CBC:  Recent Labs Lab 02/25/16 1930 02/26/16 0347  WBC 9.1 8.6  NEUTROABS 7.5*  --   HGB 14.3 12.6  HCT 43.1 38.0  MCV 93.7 94.4  PLT 279 252   Cardiac Enzymes: No results for input(s): CKTOTAL, CKMB, CKMBINDEX, TROPONINI in the last 168 hours. BNP: Invalid input(s): POCBNP CBG: No results for input(s): GLUCAP in the last 168 hours.  Radiological Exams: Dg Ankle Complete  Left  Result Date: 02/25/2016 CLINICAL DATA:  Trimalleolar ankle fracture. EXAM: LEFT ANKLE COMPLETE - 3+ VIEW COMPARISON:  Earlier the same day. FINDINGS: Posterior dislocation of the talus is been reduced in the interval. There is persistent lateral subluxation of the talar dome relative to the distal tibia. Comminuted distal fibula fracture noted with medial malleolus fracture the distal tibia and posterior lip distal tibia fracture associated. IMPRESSION: Interval reduction of tibiotalar dislocation although lateral subluxation of the talus relative to the tibia persists. Electronically Signed   By: Misty Stanley M.D.   On: 02/25/2016 21:50   Dg Ankle Complete Left  Result Date: 02/25/2016 CLINICAL DATA:  Status post attempted reduction of left trimalleolar ankle fracture/dislocation. Initial encounter. EXAM: LEFT ANKLE COMPLETE - 3+ VIEW COMPARISON:  None. FINDINGS: There is improved alignment of the comminuted trimalleolar fracture of the left ankle. However, there is persistent posterior dislocation of the talus, lodged against the posterior malleolar fracture site. Significantly posteriorly displaced distal fibular and posterior malleolar fragments are again noted, with a mildly displaced medial malleolar fracture. No new fractures are seen.  Surrounding soft tissue swelling is noted. The subtalar joint is grossly unremarkable. IMPRESSION: Persistent posterior dislocation of the talus, lodged against the posterior malleolar fracture site. Significantly posteriorly displaced distal fibular and posterior malleolar fragments again noted, with a mildly displaced medial malleolar fracture. Alignment is somewhat improved from the prior study. These results were called by telephone at the time of interpretation on 02/25/2016 at 8:05 pm to Nursing at the [facility], who verbally acknowledged these results. Electronically Signed   By: Garald Balding M.D.   On: 02/25/2016 20:05   Dg Ankle Complete  Left  Result Date: 02/25/2016 CLINICAL DATA:  Patient fell down stairs. Left ankle pain and deformity. EXAM: LEFT ANKLE COMPLETE - 3+ VIEW COMPARISON:  None. FINDINGS: Comminuted distal fibular fracture is associated with posterior lip fracture of the distal tibia and medial malleolar fracture. There is medial dislocation of the distal tibia relative to the talar dome. IMPRESSION: Comminuted trimalleolar fracture dislocation. Electronically Signed   By: Misty Stanley M.D.   On: 02/25/2016 17:28   Ct Ankle Left Wo Contrast  Result Date: 02/25/2016 CLINICAL DATA:  66 year old female with left ankle fracture dislocation. EXAM: CT OF THE LEFT ANKLE WITHOUT CONTRAST TECHNIQUE: Multidetector CT imaging of the left ankle was performed according to the standard protocol. Multiplanar CT image reconstructions were also generated. COMPARISON:  Left ankle radiographs dated 02/25/2016 FINDINGS: Bones/Joint/Cartilage There is a displaced and comminuted fracture of the distal fibula. There is extension of the fracture fragment into the lateral tibiotalar articulation. There is displaced corner fracture of the lateral aspect of the distal tibia at the talotibial articulation. There is fracture of the medial malleolus with approximately 5 mm lateral displacement of the distal fracture fragment. There is mildly displaced comminuted fracture of the posterior malleolus. No other acute fracture identified. The bones are osteopenic. There is approximately 5 mm lateral subluxation of the talus in relation to the tibia. Ligaments Suboptimally assessed by CT. Muscles and Tendons No significant intramuscular hematoma. No definite tendon injury by CT. The Achilles tendon is intact. Soft tissues There is diffuse subcutaneous soft tissue edema as well as edema and contusion of the deeper fascia layer. A 15 mm rounded low attenuating intermuscular focus in the posterior compartment of the distal leg demonstrates fat attenuation.  IMPRESSION: 1. Comminuted and displaced fracture of the distal fibula as well as fractures of the posterior and medial malleoli. There is also mildly displaced corner fracture of the lateral distal tibia at the talotibial articulation. 2. Approximately 5 mm lateral subluxation of the talus in relation to the tibia. Electronically Signed   By: Anner Crete M.D.   On: 02/25/2016 22:57   Ct 3d Recon At Scanner  Result Date: 02/25/2016 CLINICAL DATA:  66 year old female with left ankle fracture dislocation. EXAM: CT OF THE LEFT ANKLE WITHOUT CONTRAST TECHNIQUE: Multidetector CT imaging of the left ankle was performed according to the standard protocol. Multiplanar CT image reconstructions were also generated. COMPARISON:  Left ankle radiographs dated 02/25/2016 FINDINGS: Bones/Joint/Cartilage There is a displaced and comminuted fracture of the distal fibula. There is extension of the fracture fragment into the lateral tibiotalar articulation. There is displaced corner fracture of the lateral aspect of the distal tibia at the talotibial articulation. There is fracture of the medial malleolus with approximately 5 mm lateral displacement of the distal fracture fragment. There is mildly displaced comminuted fracture of the posterior malleolus. No other acute fracture identified. The bones are osteopenic. There is approximately 5 mm lateral  subluxation of the talus in relation to the tibia. Ligaments Suboptimally assessed by CT. Muscles and Tendons No significant intramuscular hematoma. No definite tendon injury by CT. The Achilles tendon is intact. Soft tissues There is diffuse subcutaneous soft tissue edema as well as edema and contusion of the deeper fascia layer. A 15 mm rounded low attenuating intermuscular focus in the posterior compartment of the distal leg demonstrates fat attenuation. IMPRESSION: 1. Comminuted and displaced fracture of the distal fibula as well as fractures of the posterior and medial  malleoli. There is also mildly displaced corner fracture of the lateral distal tibia at the talotibial articulation. 2. Approximately 5 mm lateral subluxation of the talus in relation to the tibia. Electronically Signed   By: Anner Crete M.D.   On: 02/25/2016 22:57   Chest Portable 1 View  Result Date: 02/26/2016 CLINICAL DATA:  Preoperative evaluation prior to ankle surgery. EXAM: PORTABLE CHEST 1 VIEW COMPARISON:  None. FINDINGS: The cardiac and mediastinal silhouettes are within normal limits. Mild aortic atherosclerosis. The lungs are normally inflated. No airspace consolidation, pleural effusion, or pulmonary edema is identified. There is no pneumothorax. No acute osseous abnormality identified. IMPRESSION: 1. No active cardiopulmonary disease. 2. Aortic atherosclerosis. Electronically Signed   By: Jeannine Boga M.D.   On: 02/26/2016 02:14    EKG: NSR nonspecificTW changes in anterolateral lead   Thank you for allowing me to participate in the care of your patient. We will continue to follow.   Note: This dictation was prepared with Dragon dictation along with smaller phrase technology. Any transcriptional errors that result from this process are unintentional.  Time spent: 45 minutes  Davaun Quintela, MD

## 2016-02-26 NOTE — Progress Notes (Signed)
Subjective:  POST OP CHECK:  S/p ORIF of left ankle fracture dislocation.  Patient reports left ankle pain as moderate.  Her friend, Ms. Baker is at the bedside.  Patient is still coming out of anesthesia. She is slightly confused and tearful.  Objective:   VITALS:   Vitals:   02/26/16 1719 02/26/16 1721 02/26/16 1734 02/26/16 1736  BP:  (!) 141/72  (!) 128/59  Pulse: 97 97  94  Resp: 13 12  18   Temp:  97.5 F (36.4 C)    TempSrc:      SpO2: 97% 97% 97% 100%  Weight:      Height:        PHYSICAL EXAM:  Left lower extremity: AO splint is clean dry and intact. The foot is elevated on pillows. Her toes on the left foot are well-perfused. She has intact sensation in all 5 toes and in the first dorsal webspace. She can flex and extend her toes.  LABS  Results for orders placed or performed during the hospital encounter of 02/25/16 (from the past 24 hour(s))  Protime-INR     Status: None   Collection Time: 02/25/16  7:30 PM  Result Value Ref Range   Prothrombin Time 12.8 11.4 - 15.2 seconds   INR 0.96   CBC WITH DIFFERENTIAL     Status: Abnormal   Collection Time: 02/25/16  7:30 PM  Result Value Ref Range   WBC 9.1 3.6 - 11.0 K/uL   RBC 4.60 3.80 - 5.20 MIL/uL   Hemoglobin 14.3 12.0 - 16.0 g/dL   HCT 43.1 35.0 - 47.0 %   MCV 93.7 80.0 - 100.0 fL   MCH 31.1 26.0 - 34.0 pg   MCHC 33.2 32.0 - 36.0 g/dL   RDW 13.2 11.5 - 14.5 %   Platelets 279 150 - 440 K/uL   Neutrophils Relative % 82 %   Neutro Abs 7.5 (H) 1.4 - 6.5 K/uL   Lymphocytes Relative 12 %   Lymphs Abs 1.1 1.0 - 3.6 K/uL   Monocytes Relative 5 %   Monocytes Absolute 0.4 0.2 - 0.9 K/uL   Eosinophils Relative 0 %   Eosinophils Absolute 0.0 0 - 0.7 K/uL   Basophils Relative 1 %   Basophils Absolute 0.1 0 - 0.1 K/uL  Comprehensive metabolic panel     Status: Abnormal   Collection Time: 02/25/16  7:30 PM  Result Value Ref Range   Sodium 140 135 - 145 mmol/L   Potassium 3.9 3.5 - 5.1 mmol/L   Chloride 104  101 - 111 mmol/L   CO2 29 22 - 32 mmol/L   Glucose, Bld 92 65 - 99 mg/dL   BUN 11 6 - 20 mg/dL   Creatinine, Ser 0.80 0.44 - 1.00 mg/dL   Calcium 9.0 8.9 - 10.3 mg/dL   Total Protein 7.2 6.5 - 8.1 g/dL   Albumin 4.5 3.5 - 5.0 g/dL   AST 19 15 - 41 U/L   ALT 13 (L) 14 - 54 U/L   Alkaline Phosphatase 55 38 - 126 U/L   Total Bilirubin 0.6 0.3 - 1.2 mg/dL   GFR calc non Af Amer >60 >60 mL/min   GFR calc Af Amer >60 >60 mL/min   Anion gap 7 5 - 15  APTT     Status: None   Collection Time: 02/25/16  7:30 PM  Result Value Ref Range   aPTT 27 24 - 36 seconds  Type and screen If not already done in  ED     Status: None   Collection Time: 02/25/16  7:30 PM  Result Value Ref Range   ABO/RH(D) A POS    Antibody Screen NEG    Sample Expiration 02/28/2016   Surgical PCR screen     Status: None   Collection Time: 02/26/16 12:35 AM  Result Value Ref Range   MRSA, PCR NEGATIVE NEGATIVE   Staphylococcus aureus NEGATIVE NEGATIVE  CBC     Status: None   Collection Time: 02/26/16  3:47 AM  Result Value Ref Range   WBC 8.6 3.6 - 11.0 K/uL   RBC 4.02 3.80 - 5.20 MIL/uL   Hemoglobin 12.6 12.0 - 16.0 g/dL   HCT 38.0 35.0 - 47.0 %   MCV 94.4 80.0 - 100.0 fL   MCH 31.4 26.0 - 34.0 pg   MCHC 33.2 32.0 - 36.0 g/dL   RDW 13.3 11.5 - 14.5 %   Platelets 252 150 - 440 K/uL  Basic metabolic panel     Status: Abnormal   Collection Time: 02/26/16  3:47 AM  Result Value Ref Range   Sodium 140 135 - 145 mmol/L   Potassium 3.6 3.5 - 5.1 mmol/L   Chloride 105 101 - 111 mmol/L   CO2 29 22 - 32 mmol/L   Glucose, Bld 98 65 - 99 mg/dL   BUN 13 6 - 20 mg/dL   Creatinine, Ser 0.86 0.44 - 1.00 mg/dL   Calcium 8.5 (L) 8.9 - 10.3 mg/dL   GFR calc non Af Amer >60 >60 mL/min   GFR calc Af Amer >60 >60 mL/min   Anion gap 6 5 - 15    Dg Ankle Complete Left  Result Date: 02/26/2016 CLINICAL DATA:  Status post fixation of left ankle fractures today. Initial encounter. EXAM: LEFT ANKLE COMPLETE - 3+ VIEW  COMPARISON:  CT left ankle 02/25/2016. FINDINGS: The patient is in a new fiberglass cast. Lateral plate and screws and interfragmentary screws are in place for fixation of a distal fibular fracture. Two screws are seen in both the posterior and medial malleolus for fracture fixation. Position and alignment are anatomic. No acute abnormality is identified. IMPRESSION: Status post fixation of trimalleolar fractures without evidence of complication. Electronically Signed   By: Inge Rise M.D.   On: 02/26/2016 17:25   Dg Ankle Complete Left  Result Date: 02/25/2016 CLINICAL DATA:  Trimalleolar ankle fracture. EXAM: LEFT ANKLE COMPLETE - 3+ VIEW COMPARISON:  Earlier the same day. FINDINGS: Posterior dislocation of the talus is been reduced in the interval. There is persistent lateral subluxation of the talar dome relative to the distal tibia. Comminuted distal fibula fracture noted with medial malleolus fracture the distal tibia and posterior lip distal tibia fracture associated. IMPRESSION: Interval reduction of tibiotalar dislocation although lateral subluxation of the talus relative to the tibia persists. Electronically Signed   By: Misty Stanley M.D.   On: 02/25/2016 21:50   Dg Ankle Complete Left  Result Date: 02/25/2016 CLINICAL DATA:  Status post attempted reduction of left trimalleolar ankle fracture/dislocation. Initial encounter. EXAM: LEFT ANKLE COMPLETE - 3+ VIEW COMPARISON:  None. FINDINGS: There is improved alignment of the comminuted trimalleolar fracture of the left ankle. However, there is persistent posterior dislocation of the talus, lodged against the posterior malleolar fracture site. Significantly posteriorly displaced distal fibular and posterior malleolar fragments are again noted, with a mildly displaced medial malleolar fracture. No new fractures are seen. Surrounding soft tissue swelling is noted. The subtalar joint is grossly unremarkable.  IMPRESSION: Persistent posterior  dislocation of the talus, lodged against the posterior malleolar fracture site. Significantly posteriorly displaced distal fibular and posterior malleolar fragments again noted, with a mildly displaced medial malleolar fracture. Alignment is somewhat improved from the prior study. These results were called by telephone at the time of interpretation on 02/25/2016 at 8:05 pm to Nursing at the [facility], who verbally acknowledged these results. Electronically Signed   By: Garald Balding M.D.   On: 02/25/2016 20:05   Dg Ankle Complete Left  Result Date: 02/25/2016 CLINICAL DATA:  Patient fell down stairs. Left ankle pain and deformity. EXAM: LEFT ANKLE COMPLETE - 3+ VIEW COMPARISON:  None. FINDINGS: Comminuted distal fibular fracture is associated with posterior lip fracture of the distal tibia and medial malleolar fracture. There is medial dislocation of the distal tibia relative to the talar dome. IMPRESSION: Comminuted trimalleolar fracture dislocation. Electronically Signed   By: Misty Stanley M.D.   On: 02/25/2016 17:28   Ct Ankle Left Wo Contrast  Result Date: 02/25/2016 CLINICAL DATA:  66 year old female with left ankle fracture dislocation. EXAM: CT OF THE LEFT ANKLE WITHOUT CONTRAST TECHNIQUE: Multidetector CT imaging of the left ankle was performed according to the standard protocol. Multiplanar CT image reconstructions were also generated. COMPARISON:  Left ankle radiographs dated 02/25/2016 FINDINGS: Bones/Joint/Cartilage There is a displaced and comminuted fracture of the distal fibula. There is extension of the fracture fragment into the lateral tibiotalar articulation. There is displaced corner fracture of the lateral aspect of the distal tibia at the talotibial articulation. There is fracture of the medial malleolus with approximately 5 mm lateral displacement of the distal fracture fragment. There is mildly displaced comminuted fracture of the posterior malleolus. No other acute  fracture identified. The bones are osteopenic. There is approximately 5 mm lateral subluxation of the talus in relation to the tibia. Ligaments Suboptimally assessed by CT. Muscles and Tendons No significant intramuscular hematoma. No definite tendon injury by CT. The Achilles tendon is intact. Soft tissues There is diffuse subcutaneous soft tissue edema as well as edema and contusion of the deeper fascia layer. A 15 mm rounded low attenuating intermuscular focus in the posterior compartment of the distal leg demonstrates fat attenuation. IMPRESSION: 1. Comminuted and displaced fracture of the distal fibula as well as fractures of the posterior and medial malleoli. There is also mildly displaced corner fracture of the lateral distal tibia at the talotibial articulation. 2. Approximately 5 mm lateral subluxation of the talus in relation to the tibia. Electronically Signed   By: Anner Crete M.D.   On: 02/25/2016 22:57   Ct 3d Recon At Scanner  Result Date: 02/25/2016 CLINICAL DATA:  66 year old female with left ankle fracture dislocation. EXAM: CT OF THE LEFT ANKLE WITHOUT CONTRAST TECHNIQUE: Multidetector CT imaging of the left ankle was performed according to the standard protocol. Multiplanar CT image reconstructions were also generated. COMPARISON:  Left ankle radiographs dated 02/25/2016 FINDINGS: Bones/Joint/Cartilage There is a displaced and comminuted fracture of the distal fibula. There is extension of the fracture fragment into the lateral tibiotalar articulation. There is displaced corner fracture of the lateral aspect of the distal tibia at the talotibial articulation. There is fracture of the medial malleolus with approximately 5 mm lateral displacement of the distal fracture fragment. There is mildly displaced comminuted fracture of the posterior malleolus. No other acute fracture identified. The bones are osteopenic. There is approximately 5 mm lateral subluxation of the talus in relation to  the tibia. Ligaments Suboptimally  assessed by CT. Muscles and Tendons No significant intramuscular hematoma. No definite tendon injury by CT. The Achilles tendon is intact. Soft tissues There is diffuse subcutaneous soft tissue edema as well as edema and contusion of the deeper fascia layer. A 15 mm rounded low attenuating intermuscular focus in the posterior compartment of the distal leg demonstrates fat attenuation. IMPRESSION: 1. Comminuted and displaced fracture of the distal fibula as well as fractures of the posterior and medial malleoli. There is also mildly displaced corner fracture of the lateral distal tibia at the talotibial articulation. 2. Approximately 5 mm lateral subluxation of the talus in relation to the tibia. Electronically Signed   By: Anner Crete M.D.   On: 02/25/2016 22:57   Chest Portable 1 View  Result Date: 02/26/2016 CLINICAL DATA:  Preoperative evaluation prior to ankle surgery. EXAM: PORTABLE CHEST 1 VIEW COMPARISON:  None. FINDINGS: The cardiac and mediastinal silhouettes are within normal limits. Mild aortic atherosclerosis. The lungs are normally inflated. No airspace consolidation, pleural effusion, or pulmonary edema is identified. There is no pneumothorax. No acute osseous abnormality identified. IMPRESSION: 1. No active cardiopulmonary disease. 2. Aortic atherosclerosis. Electronically Signed   By: Jeannine Boga M.D.   On: 02/26/2016 02:14    Assessment/Plan: Day of Surgery   Active Problems:   Fracture dislocation of ankle joint, left, closed, initial encounter  Patient is stable postop. He is having moderate pain in left ankle. I reviewed the postop x-ray.  Fracture is well reduced. Hardware is well positioned. The ankle is located. There is no subluxation.  Continue strict elevation left lower extremity. Ice may be applied to the left ankle. Patient is nonweightbearing on the left lower extremity for total of 6-8 weeks. Continue regular neurovascular  checks overnight.  Lovenox to start in the morning. Postop labs will be checked in the a.m. She'll complete 24 hours postop antibiotics. Encourage incentive spirometry while awake. PT will start tomorrow.    Thornton Park , MD 02/26/2016, 6:14 PM

## 2016-02-26 NOTE — Anesthesia Postprocedure Evaluation (Signed)
Anesthesia Post Note  Patient: Margaret Warner  Procedure(s) Performed: Procedure(s) (LRB): OPEN REDUCTION INTERNAL FIXATION (ORIF) ANKLE FRACTURE (Left)  Patient location during evaluation: PACU Anesthesia Type: General Level of consciousness: awake and alert Pain management: pain level controlled Vital Signs Assessment: post-procedure vital signs reviewed and stable Respiratory status: spontaneous breathing and respiratory function stable Cardiovascular status: stable Anesthetic complications: no     Last Vitals:  Vitals:   02/26/16 1141 02/26/16 1636  BP: (!) 153/62 122/70  Pulse: 62 (!) 115  Resp: 20 17  Temp: 37.4 C 36.3 C    Last Pain:  Vitals:   02/26/16 1141  TempSrc: Tympanic  PainSc: 5                  Kia Varnadore K

## 2016-02-26 NOTE — Anesthesia Procedure Notes (Signed)
Procedure Name: Intubation Date/Time: 02/26/2016 12:48 PM Performed by: Doreen Salvage Pre-anesthesia Checklist: Patient identified, Patient being monitored, Timeout performed, Emergency Drugs available and Suction available Patient Re-evaluated:Patient Re-evaluated prior to inductionOxygen Delivery Method: Circle system utilized Preoxygenation: Pre-oxygenation with 100% oxygen Intubation Type: IV induction Ventilation: Mask ventilation without difficulty Laryngoscope Size: Mac, McGraph and 4 Grade View: Grade II Tube type: MLT Tube size: 7.0 mm Number of attempts: 1 Airway Equipment and Method: Stylet Placement Confirmation: ETT inserted through vocal cords under direct vision,  positive ETCO2 and breath sounds checked- equal and bilateral Secured at: 18 cm Tube secured with: Tape Dental Injury: Teeth and Oropharynx as per pre-operative assessment  Difficulty Due To: Difficult Airway- due to anterior larynx

## 2016-02-26 NOTE — Progress Notes (Signed)
Enoxaparin 40 mg daily to start 2/20 @ 0800. Patient post-op ankle sx -- DVT prophylaxis dose appropriate.  Tobie Lords, PharmD, BCPS Clinical Pharmacist 02/26/2016

## 2016-02-26 NOTE — Progress Notes (Signed)
Subjective:  Patient seen in the preoperative area. Patient reports left ankle pain as marked.    Objective:   VITALS:   Vitals:   02/25/16 2351 02/26/16 0540 02/26/16 0734 02/26/16 1141  BP: (!) 158/71 (!) 135/52 136/67 (!) 153/62  Pulse: 63 63 (!) 59 62  Resp:  18 16 20   Temp: 97.6 F (36.4 C) 98.1 F (36.7 C)  99.3 F (37.4 C)  TempSrc: Oral Oral  Tympanic  SpO2: 100% 97% 97% 100%  Weight: 63.2 kg (139 lb 6.4 oz)     Height:        PHYSICAL EXAM:  Left lower; patient's splint and dressing remain clean dry and intact.  Patient's toes are well-perfused. She can gently flex and extend her toes. She has intact sensation light touch.   LABS  Results for orders placed or performed during the hospital encounter of 02/25/16 (from the past 24 hour(s))  Protime-INR     Status: None   Collection Time: 02/25/16  7:30 PM  Result Value Ref Range   Prothrombin Time 12.8 11.4 - 15.2 seconds   INR 0.96   CBC WITH DIFFERENTIAL     Status: Abnormal   Collection Time: 02/25/16  7:30 PM  Result Value Ref Range   WBC 9.1 3.6 - 11.0 K/uL   RBC 4.60 3.80 - 5.20 MIL/uL   Hemoglobin 14.3 12.0 - 16.0 g/dL   HCT 43.1 35.0 - 47.0 %   MCV 93.7 80.0 - 100.0 fL   MCH 31.1 26.0 - 34.0 pg   MCHC 33.2 32.0 - 36.0 g/dL   RDW 13.2 11.5 - 14.5 %   Platelets 279 150 - 440 K/uL   Neutrophils Relative % 82 %   Neutro Abs 7.5 (H) 1.4 - 6.5 K/uL   Lymphocytes Relative 12 %   Lymphs Abs 1.1 1.0 - 3.6 K/uL   Monocytes Relative 5 %   Monocytes Absolute 0.4 0.2 - 0.9 K/uL   Eosinophils Relative 0 %   Eosinophils Absolute 0.0 0 - 0.7 K/uL   Basophils Relative 1 %   Basophils Absolute 0.1 0 - 0.1 K/uL  Comprehensive metabolic panel     Status: Abnormal   Collection Time: 02/25/16  7:30 PM  Result Value Ref Range   Sodium 140 135 - 145 mmol/L   Potassium 3.9 3.5 - 5.1 mmol/L   Chloride 104 101 - 111 mmol/L   CO2 29 22 - 32 mmol/L   Glucose, Bld 92 65 - 99 mg/dL   BUN 11 6 - 20 mg/dL    Creatinine, Ser 0.80 0.44 - 1.00 mg/dL   Calcium 9.0 8.9 - 10.3 mg/dL   Total Protein 7.2 6.5 - 8.1 g/dL   Albumin 4.5 3.5 - 5.0 g/dL   AST 19 15 - 41 U/L   ALT 13 (L) 14 - 54 U/L   Alkaline Phosphatase 55 38 - 126 U/L   Total Bilirubin 0.6 0.3 - 1.2 mg/dL   GFR calc non Af Amer >60 >60 mL/min   GFR calc Af Amer >60 >60 mL/min   Anion gap 7 5 - 15  APTT     Status: None   Collection Time: 02/25/16  7:30 PM  Result Value Ref Range   aPTT 27 24 - 36 seconds  Type and screen If not already done in ED     Status: None   Collection Time: 02/25/16  7:30 PM  Result Value Ref Range   ABO/RH(D) A POS  Antibody Screen NEG    Sample Expiration 02/28/2016   Surgical PCR screen     Status: None   Collection Time: 02/26/16 12:35 AM  Result Value Ref Range   MRSA, PCR NEGATIVE NEGATIVE   Staphylococcus aureus NEGATIVE NEGATIVE  CBC     Status: None   Collection Time: 02/26/16  3:47 AM  Result Value Ref Range   WBC 8.6 3.6 - 11.0 K/uL   RBC 4.02 3.80 - 5.20 MIL/uL   Hemoglobin 12.6 12.0 - 16.0 g/dL   HCT 38.0 35.0 - 47.0 %   MCV 94.4 80.0 - 100.0 fL   MCH 31.4 26.0 - 34.0 pg   MCHC 33.2 32.0 - 36.0 g/dL   RDW 13.3 11.5 - 14.5 %   Platelets 252 150 - 440 K/uL  Basic metabolic panel     Status: Abnormal   Collection Time: 02/26/16  3:47 AM  Result Value Ref Range   Sodium 140 135 - 145 mmol/L   Potassium 3.6 3.5 - 5.1 mmol/L   Chloride 105 101 - 111 mmol/L   CO2 29 22 - 32 mmol/L   Glucose, Bld 98 65 - 99 mg/dL   BUN 13 6 - 20 mg/dL   Creatinine, Ser 0.86 0.44 - 1.00 mg/dL   Calcium 8.5 (L) 8.9 - 10.3 mg/dL   GFR calc non Af Amer >60 >60 mL/min   GFR calc Af Amer >60 >60 mL/min   Anion gap 6 5 - 15    Dg Ankle Complete Left  Result Date: 02/25/2016 CLINICAL DATA:  Trimalleolar ankle fracture. EXAM: LEFT ANKLE COMPLETE - 3+ VIEW COMPARISON:  Earlier the same day. FINDINGS: Posterior dislocation of the talus is been reduced in the interval. There is persistent lateral  subluxation of the talar dome relative to the distal tibia. Comminuted distal fibula fracture noted with medial malleolus fracture the distal tibia and posterior lip distal tibia fracture associated. IMPRESSION: Interval reduction of tibiotalar dislocation although lateral subluxation of the talus relative to the tibia persists. Electronically Signed   By: Misty Stanley M.D.   On: 02/25/2016 21:50   Dg Ankle Complete Left  Result Date: 02/25/2016 CLINICAL DATA:  Status post attempted reduction of left trimalleolar ankle fracture/dislocation. Initial encounter. EXAM: LEFT ANKLE COMPLETE - 3+ VIEW COMPARISON:  None. FINDINGS: There is improved alignment of the comminuted trimalleolar fracture of the left ankle. However, there is persistent posterior dislocation of the talus, lodged against the posterior malleolar fracture site. Significantly posteriorly displaced distal fibular and posterior malleolar fragments are again noted, with a mildly displaced medial malleolar fracture. No new fractures are seen. Surrounding soft tissue swelling is noted. The subtalar joint is grossly unremarkable. IMPRESSION: Persistent posterior dislocation of the talus, lodged against the posterior malleolar fracture site. Significantly posteriorly displaced distal fibular and posterior malleolar fragments again noted, with a mildly displaced medial malleolar fracture. Alignment is somewhat improved from the prior study. These results were called by telephone at the time of interpretation on 02/25/2016 at 8:05 pm to Nursing at the [facility], who verbally acknowledged these results. Electronically Signed   By: Garald Balding M.D.   On: 02/25/2016 20:05   Dg Ankle Complete Left  Result Date: 02/25/2016 CLINICAL DATA:  Patient fell down stairs. Left ankle pain and deformity. EXAM: LEFT ANKLE COMPLETE - 3+ VIEW COMPARISON:  None. FINDINGS: Comminuted distal fibular fracture is associated with posterior lip fracture of the  distal tibia and medial malleolar fracture. There is medial dislocation of  the distal tibia relative to the talar dome. IMPRESSION: Comminuted trimalleolar fracture dislocation. Electronically Signed   By: Misty Stanley M.D.   On: 02/25/2016 17:28   Ct Ankle Left Wo Contrast  Result Date: 02/25/2016 CLINICAL DATA:  66 year old female with left ankle fracture dislocation. EXAM: CT OF THE LEFT ANKLE WITHOUT CONTRAST TECHNIQUE: Multidetector CT imaging of the left ankle was performed according to the standard protocol. Multiplanar CT image reconstructions were also generated. COMPARISON:  Left ankle radiographs dated 02/25/2016 FINDINGS: Bones/Joint/Cartilage There is a displaced and comminuted fracture of the distal fibula. There is extension of the fracture fragment into the lateral tibiotalar articulation. There is displaced corner fracture of the lateral aspect of the distal tibia at the talotibial articulation. There is fracture of the medial malleolus with approximately 5 mm lateral displacement of the distal fracture fragment. There is mildly displaced comminuted fracture of the posterior malleolus. No other acute fracture identified. The bones are osteopenic. There is approximately 5 mm lateral subluxation of the talus in relation to the tibia. Ligaments Suboptimally assessed by CT. Muscles and Tendons No significant intramuscular hematoma. No definite tendon injury by CT. The Achilles tendon is intact. Soft tissues There is diffuse subcutaneous soft tissue edema as well as edema and contusion of the deeper fascia layer. A 15 mm rounded low attenuating intermuscular focus in the posterior compartment of the distal leg demonstrates fat attenuation. IMPRESSION: 1. Comminuted and displaced fracture of the distal fibula as well as fractures of the posterior and medial malleoli. There is also mildly displaced corner fracture of the lateral distal tibia at the talotibial articulation. 2. Approximately 5 mm  lateral subluxation of the talus in relation to the tibia. Electronically Signed   By: Anner Crete M.D.   On: 02/25/2016 22:57   Ct 3d Recon At Scanner  Result Date: 02/25/2016 CLINICAL DATA:  66 year old female with left ankle fracture dislocation. EXAM: CT OF THE LEFT ANKLE WITHOUT CONTRAST TECHNIQUE: Multidetector CT imaging of the left ankle was performed according to the standard protocol. Multiplanar CT image reconstructions were also generated. COMPARISON:  Left ankle radiographs dated 02/25/2016 FINDINGS: Bones/Joint/Cartilage There is a displaced and comminuted fracture of the distal fibula. There is extension of the fracture fragment into the lateral tibiotalar articulation. There is displaced corner fracture of the lateral aspect of the distal tibia at the talotibial articulation. There is fracture of the medial malleolus with approximately 5 mm lateral displacement of the distal fracture fragment. There is mildly displaced comminuted fracture of the posterior malleolus. No other acute fracture identified. The bones are osteopenic. There is approximately 5 mm lateral subluxation of the talus in relation to the tibia. Ligaments Suboptimally assessed by CT. Muscles and Tendons No significant intramuscular hematoma. No definite tendon injury by CT. The Achilles tendon is intact. Soft tissues There is diffuse subcutaneous soft tissue edema as well as edema and contusion of the deeper fascia layer. A 15 mm rounded low attenuating intermuscular focus in the posterior compartment of the distal leg demonstrates fat attenuation. IMPRESSION: 1. Comminuted and displaced fracture of the distal fibula as well as fractures of the posterior and medial malleoli. There is also mildly displaced corner fracture of the lateral distal tibia at the talotibial articulation. 2. Approximately 5 mm lateral subluxation of the talus in relation to the tibia. Electronically Signed   By: Anner Crete M.D.   On: 02/25/2016  22:57   Chest Portable 1 View  Result Date: 02/26/2016 CLINICAL DATA:  Preoperative evaluation  prior to ankle surgery. EXAM: PORTABLE CHEST 1 VIEW COMPARISON:  None. FINDINGS: The cardiac and mediastinal silhouettes are within normal limits. Mild aortic atherosclerosis. The lungs are normally inflated. No airspace consolidation, pleural effusion, or pulmonary edema is identified. There is no pneumothorax. No acute osseous abnormality identified. IMPRESSION: 1. No active cardiopulmonary disease. 2. Aortic atherosclerosis. Electronically Signed   By: Jeannine Boga M.D.   On: 02/26/2016 02:14    Assessment/Plan: Day of Surgery   Active Problems:   Fracture dislocation of ankle joint, left, closed, initial encounter  Patient has been nothing by mouth since midnight. She has been cleared for surgery by Dr. Benjie Karvonen from the hospitalist service. Patient is scheduled for ORIF today. I answered all her questions. She is aware the risks and benefits of surgery.    Thornton Park , MD 02/26/2016, 12:31 PM

## 2016-02-26 NOTE — Clinical Social Work Note (Signed)
Clinical Social Work Assessment  Patient Details  Name: Margaret Warner MRN: [id number] Date of Birth: 04/19/1950  Date of referral:  02/26/16               Reason for consult:  Facility Placement                Permission sought to share information with:    Permission granted to share information::     Name::      Skilled Nursing Facility VS. Home Health   Agency::     Relationship::     Contact Information:     Housing/Transportation Living arrangements for the past 2 months:  Single Family Home Source of Information:  Patient, Friend/Neighbor Patient Interpreter Needed:  None Criminal Activity/Legal Involvement Pertinent to Current Situation/Hospitalization:  No - Comment as needed Significant Relationships:  Friend Lives with:  Self Do you feel safe going back to the place where you live?  Yes Need for family participation in patient care:  No (Coment)  Care giving concerns:  Patient lives in [place] alone.    Social Worker assessment / plan:  Clinical Social Worker (CSW) reviewed chart and noted that patient is going for surgery for an ankle fracture today. CSW met with patient and her friend Margaret Warner [phone] prior to surgery. Patient was alert and oriented and was laying in the bed. CSW introduced self and explained role of CSW department. 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 Patient reported that she is independent with all her ADL's, and has plenty of support from friends and family. CSW explained that PT will work with patient after surgery and make a recommendation of home health or SNF. Patient reported that she is not going to SNF and she would take home health. CSW will continue to follow and assist as needed.   Employment status:  Full-Time Insurance information:  Medicare PT Recommendations:  Not assessed at this time Information / Referral to community resources:  Other (Comment Required) (Patient refused  SNF and prefers home health. )  Patient/Family's Response to care:  Patient prefers to go home with home health.   Patient/Family's Understanding of and Emotional Response to Diagnosis, Current Treatment, and Prognosis:  Patient was very pleasant and thanked CSW for assistance.   Emotional Assessment Appearance:  Appears stated age Attitude/Demeanor/Rapport:    Affect (typically observed):  Accepting, Adaptable, Pleasant Orientation:  Oriented to Self, Oriented to Place, Oriented to  Time, Oriented to Situation Alcohol / Substance use:  Not Applicable Psych involvement (Current and /or in the community):  No (Comment)  Discharge Needs  Concerns to be addressed:  Discharge Planning Concerns Readmission within the last 30 days:  No Current discharge risk:  Dependent with Mobility Barriers to Discharge:  Continued Medical Work up   ,  M, LCSW 02/26/2016, 3:41 PM  

## 2016-02-26 NOTE — Transfer of Care (Signed)
Immediate Anesthesia Transfer of Care Note  Patient: Margaret Warner  Procedure(s) Performed: Procedure(s): OPEN REDUCTION INTERNAL FIXATION (ORIF) ANKLE FRACTURE (Left)  Patient Location: PACU  Anesthesia Type:General  Level of Consciousness: sedated  Airway & Oxygen Therapy: Patient Spontanous Breathing and Patient connected to face mask oxygen  Post-op Assessment: Report given to RN and Post -op Vital signs reviewed and stable  Post vital signs: Reviewed and stable  Last Vitals:  Vitals:   02/26/16 1141 02/26/16 1636  BP: (!) 153/62 122/70  Pulse: 62 (!) 115  Resp: 20 17  Temp: 37.4 C [id number] C    Complications: No apparent anesthesia complications

## 2016-02-26 NOTE — Anesthesia Preprocedure Evaluation (Signed)
Anesthesia Evaluation  Patient identified by MRN, date of birth, ID band Patient awake    Reviewed: Allergy & Precautions, H&P , NPO status , Patient's Chart, lab work & pertinent test results  History of Anesthesia Complications (+) AWARENESS UNDER ANESTHESIA and history of anesthetic complications  Airway Mallampati: III  TM Distance: <3 FB Neck ROM: full    Dental  (+) Poor Dentition, Chipped, Implants, Caps, Dental Advidsory Given   Pulmonary neg shortness of breath, COPD, former smoker,    Pulmonary exam normal breath sounds clear to auscultation       Cardiovascular Exercise Tolerance: Good (-) angina(-) Past MI and (-) DOE negative cardio ROS Normal cardiovascular exam Rhythm:regular Rate:Normal     Neuro/Psych PSYCHIATRIC DISORDERS negative neurological ROS     GI/Hepatic negative GI ROS, Neg liver ROS, neg GERD  ,  Endo/Other  negative endocrine ROS  Renal/GU      Musculoskeletal   Abdominal   Peds  Hematology negative hematology ROS (+)   Anesthesia Other Findings Past Medical History: No date: Depression No date: Skin cancer  Past Surgical History: No date: BACK SURGERY 2011: BREAST BIOPSY Left     Comment: EXCISIONAL - NEG  BMI    Body Mass Index:  24.69 kg/m      Reproductive/Obstetrics negative OB ROS                            Anesthesia Physical Anesthesia Plan  ASA: III  Anesthesia Plan: General ETT   Post-op Pain Management:    Induction:   Airway Management Planned:   Additional Equipment:   Intra-op Plan:   Post-operative Plan:   Informed Consent: I have reviewed the patients History and Physical, chart, labs and discussed the procedure including the risks, benefits and alternatives for the proposed anesthesia with the patient or authorized representative who has indicated his/her understanding and acceptance.   Dental Advisory Given  Plan  Discussed with: Anesthesiologist, CRNA and Surgeon  Anesthesia Plan Comments:        Anesthesia Quick Evaluation

## 2016-02-26 NOTE — Anesthesia Post-op Follow-up Note (Cosign Needed)
Anesthesia QCDR form completed.        

## 2016-02-27 ENCOUNTER — Encounter: Payer: Self-pay | Admitting: Orthopedic Surgery

## 2016-02-27 LAB — BASIC METABOLIC PANEL
Anion gap: 6 (ref 5–15)
BUN: 7 mg/dL (ref 6–20)
CHLORIDE: 109 mmol/L (ref 101–111)
CO2: 24 mmol/L (ref 22–32)
Calcium: 7.9 mg/dL — ABNORMAL LOW (ref 8.9–10.3)
Creatinine, Ser: 0.68 mg/dL (ref 0.44–1.00)
GFR calc non Af Amer: >60 mL/min (ref >60)
Glucose, Bld: 165 mg/dL — ABNORMAL HIGH (ref 65–99)
POTASSIUM: 3.2 mmol/L — AB (ref 3.5–5.1)
SODIUM: 139 mmol/L (ref 135–145)

## 2016-02-27 LAB — CBC
HEMATOCRIT: 33 % — AB (ref 35.0–47.0)
HEMOGLOBIN: 11 g/dL — AB (ref 12.0–16.0)
MCH: 32 pg (ref 26.0–34.0)
MCHC: 33.5 g/dL (ref 32.0–36.0)
MCV: 95.6 fL (ref 80.0–100.0)
Platelets: 201 10*3/uL (ref 150–440)
RBC: 3.45 MIL/uL — AB (ref 3.80–5.20)
RDW: 13.3 % (ref 11.5–14.5)
WBC: 8.1 10*3/uL (ref 3.6–11.0)

## 2016-02-27 LAB — URINE CULTURE: Culture: NO GROWTH

## 2016-02-27 MED ORDER — POTASSIUM CHLORIDE CRYS ER 20 MEQ PO TBCR
40.0000 meq | EXTENDED_RELEASE_TABLET | Freq: Once | ORAL | Status: AC
  Administered 2016-02-27: 40 meq via ORAL
  Filled 2016-02-27: qty 2

## 2016-02-27 NOTE — Progress Notes (Signed)
PT is recommending home health. RN case manager aware of above. Please reconsult if future social work needs arise. CSW signing off.   Tiyon Sanor, LCSW [phone]  

## 2016-02-27 NOTE — Progress Notes (Addendum)
[facility] NAME: Margaret Warner    MR#:  [id number]  DATE OF BIRTH:  12-16-1958  SUBJECTIVE:   Pain is controlled. Patient did well with surgery.  REVIEW OF SYSTEMS:    Review of Systems  Constitutional: Negative.  Negative for chills, fever and malaise/fatigue.  HENT: Negative.  Negative for ear discharge, ear pain, hearing loss, nosebleeds and sore throat.   Eyes: Negative.  Negative for blurred vision and pain.  Respiratory: Negative.  Negative for cough, hemoptysis, shortness of breath and wheezing.   Cardiovascular: Negative.  Negative for chest pain, palpitations and leg swelling.  Gastrointestinal: Negative.  Negative for abdominal pain, blood in stool, diarrhea, nausea and vomiting.  Genitourinary: Negative.  Negative for dysuria.  Musculoskeletal: Negative.  Negative for back pain.  Skin: Negative.   Neurological: Negative for dizziness, tremors, speech change, focal weakness, seizures and headaches.  Endo/Heme/Allergies: Negative.  Does not bruise/bleed easily.  Psychiatric/Behavioral: Negative.  Negative for depression, hallucinations and suicidal ideas.    Tolerating Diet: yes      DRUG ALLERGIES:   Allergies  Allergen Reactions  . Tetracyclines & Related Rash    VITALS:  Blood pressure (!) 120/57, pulse 72, temperature 98.5 F (36.9 C), temperature source Oral, resp. rate 18, height 5\' 3"  (1.6 m), weight 63.2 kg (139 lb 6.4 oz), SpO2 96 %.  PHYSICAL EXAMINATION:   Physical Exam  Constitutional: She is oriented to person, place, and time and well-developed, well-nourished, and in no distress. No distress.  HENT:  Head: Normocephalic.  Eyes: No scleral icterus.  Neck: Normal range of motion. Neck supple. No JVD present. No tracheal deviation present.  Cardiovascular: Normal rate, regular rhythm and normal heart sounds.  Exam reveals no gallop and no friction rub.   No murmur heard. Pulmonary/Chest:  Effort normal and breath sounds normal. No respiratory distress. She has no wheezes. She has no rales. She exhibits no tenderness.  Abdominal: Soft. Bowel sounds are normal. She exhibits no distension and no mass. There is no tenderness. There is no rebound and no guarding.  Musculoskeletal: Normal range of motion. She exhibits no edema.  Left ankle covered  Neurological: She is alert and oriented to person, place, and time.  Skin: Skin is warm. No rash noted. No erythema.  Psychiatric: Affect and judgment normal.      LABORATORY PANEL:   CBC  Recent Labs Lab 02/27/16 0457  WBC 8.1  HGB 11.0*  HCT 33.0*  PLT 201   ------------------------------------------------------------------------------------------------------------------  Chemistries   Recent Labs Lab 02/25/16 1930  02/27/16 0457  NA 140  < > 139  K 3.9  < > 3.2*  CL 104  < > 109  CO2 29  < > 24  GLUCOSE 92  < > 165*  BUN 11  < > 7  CREATININE 0.80  < > 0.68  CALCIUM 9.0  < > 7.9*  AST 19  --   --   ALT 13*  --   --   ALKPHOS 55  --   --   BILITOT 0.6  --   --   < > = values in this interval not displayed. ------------------------------------------------------------------------------------------------------------------  Cardiac Enzymes No results for input(s): TROPONINI in the last 168 hours. ------------------------------------------------------------------------------------------------------------------  RADIOLOGY:  Dg Ankle Complete Left  Result Date: 02/26/2016 CLINICAL DATA:  Status post fixation of left ankle fractures today. Initial encounter. EXAM: LEFT ANKLE COMPLETE - 3+ VIEW COMPARISON:  CT left ankle  02/25/2016. FINDINGS: The patient is in a new fiberglass cast. Lateral plate and screws and interfragmentary screws are in place for fixation of a distal fibular fracture. Two screws are seen in both the posterior and medial malleolus for fracture fixation. Position and alignment are anatomic. No  acute abnormality is identified. IMPRESSION: Status post fixation of trimalleolar fractures without evidence of complication. Electronically Signed   By: Inge Rise M.D.   On: 02/26/2016 17:25   Dg Ankle Complete Left  Result Date: 02/25/2016 CLINICAL DATA:  Trimalleolar ankle fracture. EXAM: LEFT ANKLE COMPLETE - 3+ VIEW COMPARISON:  Earlier the same day. FINDINGS: Posterior dislocation of the talus is been reduced in the interval. There is persistent lateral subluxation of the talar dome relative to the distal tibia. Comminuted distal fibula fracture noted with medial malleolus fracture the distal tibia and posterior lip distal tibia fracture associated. IMPRESSION: Interval reduction of tibiotalar dislocation although lateral subluxation of the talus relative to the tibia persists. Electronically Signed   By: Misty Stanley M.D.   On: 02/25/2016 21:50   Dg Ankle Complete Left  Result Date: 02/25/2016 CLINICAL DATA:  Status post attempted reduction of left trimalleolar ankle fracture/dislocation. Initial encounter. EXAM: LEFT ANKLE COMPLETE - 3+ VIEW COMPARISON:  None. FINDINGS: There is improved alignment of the comminuted trimalleolar fracture of the left ankle. However, there is persistent posterior dislocation of the talus, lodged against the posterior malleolar fracture site. Significantly posteriorly displaced distal fibular and posterior malleolar fragments are again noted, with a mildly displaced medial malleolar fracture. No new fractures are seen. Surrounding soft tissue swelling is noted. The subtalar joint is grossly unremarkable. IMPRESSION: Persistent posterior dislocation of the talus, lodged against the posterior malleolar fracture site. Significantly posteriorly displaced distal fibular and posterior malleolar fragments again noted, with a mildly displaced medial malleolar fracture. Alignment is somewhat improved from the prior study. These results were called by telephone at the time  of interpretation on 02/25/2016 at 8:05 pm to Nursing at the [facility], who verbally acknowledged these results. Electronically Signed   By: Garald Balding M.D.   On: 02/25/2016 20:05   Dg Ankle Complete Left  Result Date: 02/25/2016 CLINICAL DATA:  Patient fell down stairs. Left ankle pain and deformity. EXAM: LEFT ANKLE COMPLETE - 3+ VIEW COMPARISON:  None. FINDINGS: Comminuted distal fibular fracture is associated with posterior lip fracture of the distal tibia and medial malleolar fracture. There is medial dislocation of the distal tibia relative to the talar dome. IMPRESSION: Comminuted trimalleolar fracture dislocation. Electronically Signed   By: Misty Stanley M.D.   On: 02/25/2016 17:28   Pelvis Portable  Result Date: 02/26/2016 CLINICAL DATA:  Fall down stairs. Trimalleolar fracture status post ORIF. EXAM: PORTABLE PELVIS 1-2 VIEWS COMPARISON:  None. FINDINGS: There is no evidence of pelvic fracture or diastasis. No focal osseous lesion is seen. Hip joint space widths are preserved. Clips are present in the left pelvis. IMPRESSION: No evidence of acute osseous abnormality. Electronically Signed   By: Logan Bores M.D.   On: 02/26/2016 19:10   Ct Ankle Left Wo Contrast  Result Date: 02/25/2016 CLINICAL DATA:  65 year old female with left ankle fracture dislocation. EXAM: CT OF THE LEFT ANKLE WITHOUT CONTRAST TECHNIQUE: Multidetector CT imaging of the left ankle was performed according to the standard protocol. Multiplanar CT image reconstructions were also generated. COMPARISON:  Left ankle radiographs dated 02/25/2016 FINDINGS: Bones/Joint/Cartilage There is a displaced and comminuted fracture of the distal fibula. There is extension of  the fracture fragment into the lateral tibiotalar articulation. There is displaced corner fracture of the lateral aspect of the distal tibia at the talotibial articulation. There is fracture of the medial malleolus with approximately 5 mm lateral  displacement of the distal fracture fragment. There is mildly displaced comminuted fracture of the posterior malleolus. No other acute fracture identified. The bones are osteopenic. There is approximately 5 mm lateral subluxation of the talus in relation to the tibia. Ligaments Suboptimally assessed by CT. Muscles and Tendons No significant intramuscular hematoma. No definite tendon injury by CT. The Achilles tendon is intact. Soft tissues There is diffuse subcutaneous soft tissue edema as well as edema and contusion of the deeper fascia layer. A 15 mm rounded low attenuating intermuscular focus in the posterior compartment of the distal leg demonstrates fat attenuation. IMPRESSION: 1. Comminuted and displaced fracture of the distal fibula as well as fractures of the posterior and medial malleoli. There is also mildly displaced corner fracture of the lateral distal tibia at the talotibial articulation. 2. Approximately 5 mm lateral subluxation of the talus in relation to the tibia. Electronically Signed   By: Anner Crete M.D.   On: 02/25/2016 22:57   Ct 3d Recon At Scanner  Result Date: 02/25/2016 CLINICAL DATA:  66 year old female with left ankle fracture dislocation. EXAM: CT OF THE LEFT ANKLE WITHOUT CONTRAST TECHNIQUE: Multidetector CT imaging of the left ankle was performed according to the standard protocol. Multiplanar CT image reconstructions were also generated. COMPARISON:  Left ankle radiographs dated 02/25/2016 FINDINGS: Bones/Joint/Cartilage There is a displaced and comminuted fracture of the distal fibula. There is extension of the fracture fragment into the lateral tibiotalar articulation. There is displaced corner fracture of the lateral aspect of the distal tibia at the talotibial articulation. There is fracture of the medial malleolus with approximately 5 mm lateral displacement of the distal fracture fragment. There is mildly displaced comminuted fracture of the posterior malleolus. No  other acute fracture identified. The bones are osteopenic. There is approximately 5 mm lateral subluxation of the talus in relation to the tibia. Ligaments Suboptimally assessed by CT. Muscles and Tendons No significant intramuscular hematoma. No definite tendon injury by CT. The Achilles tendon is intact. Soft tissues There is diffuse subcutaneous soft tissue edema as well as edema and contusion of the deeper fascia layer. A 15 mm rounded low attenuating intermuscular focus in the posterior compartment of the distal leg demonstrates fat attenuation. IMPRESSION: 1. Comminuted and displaced fracture of the distal fibula as well as fractures of the posterior and medial malleoli. There is also mildly displaced corner fracture of the lateral distal tibia at the talotibial articulation. 2. Approximately 5 mm lateral subluxation of the talus in relation to the tibia. Electronically Signed   By: Anner Crete M.D.   On: 02/25/2016 22:57   Chest Portable 1 View  Result Date: 02/26/2016 CLINICAL DATA:  Preoperative evaluation prior to ankle surgery. EXAM: PORTABLE CHEST 1 VIEW COMPARISON:  None. FINDINGS: The cardiac and mediastinal silhouettes are within normal limits. Mild aortic atherosclerosis. The lungs are normally inflated. No airspace consolidation, pleural effusion, or pulmonary edema is identified. There is no pneumothorax. No acute osseous abnormality identified. IMPRESSION: 1. No active cardiopulmonary disease. 2. Aortic atherosclerosis. Electronically Signed   By: Jeannine Boga M.D.   On: 02/26/2016 02:14     ASSESSMENT AND PLAN:    66 y/o female with history of depression and hyperlipidemia who presents after a mechanical fall and now has suffered a  left ankle fracture dislocation.  1. left ankle fracture: Patient is postoperative day #1. Patient may continue DVT prophylaxis. Physical therapy consultation pending.   2. Hyperlipidemia: Continue statin  3. Depression: Continue  outpatient medications  4. Hypokalemia: Repleted and can  recheck in a.m. Patient is medically stable. I will sign off. Please comminuted her further questions   Management plans discussed with the patient and she is in agreement.  CODE STATUS: full  TOTAL TIME TAKING CARE OF THIS PATIENT: 24 minutes.     POSSIBLE D/C tomorrow, DEPENDING ON CLINICAL CONDITION.   Akeelah Seppala M.D on 02/27/2016 at 8:36 AM  Between 7am to 6pm - Pager - [phone] After 6pm go to [email or web address] - password [email or web address] [facility]  Office  [phone]  CC: Primary care physician; Idelle Crouch, MD  Note: This dictation was prepared with Dragon dictation along with smaller phrase technology. Any transcriptional errors that result from this process are unintentional.

## 2016-02-27 NOTE — Evaluation (Signed)
Physical Therapy Evaluation Patient Details Name: Margaret Warner MRN: [id number] DOB: 1950/07/02 Today's Date: 02/27/2016   History of Present Illness  Pt is a 66 y/o female with history of depression and hyperlipidemia who presents after a mechanical fall and now has suffered a left ankle fracture dislocation and is s/p L ankle ORIF.    Clinical Impression  Pt presents with deficits in transfers, gait, strength, and balance but performed well with majority of mobility tasks.  Pt Ind with all bed mobility tasks and required CGA with min verbal cues for sequencing with sit to/from stand and stand pivot transfers.  Pt CGA with hop-to gait 10' using a RW with min-mod verbal and visual cues for proper sequencing.  Knee scooter training provided with CGA and min-mod verbal and visual cues for proper sequencing and general safety with forwards/backwards directions and with transfers to/from scooter.  Pt steady overall and confidence improved during training session.  Pt will benefit from PT services to address above deficits for decreased caregiver assistance and fall risk upon discharge.      Follow Up Recommendations Home health PT    Equipment Recommendations  Wheelchair (measurements PT)    Recommendations for Other Services       Precautions / Restrictions Precautions Precautions: Fall Restrictions Weight Bearing Restrictions: Yes LLE Weight Bearing: Non weight bearing      Mobility  Bed Mobility Overal bed mobility: Independent             General bed mobility comments: Ind and confident with all bed mobility tasks  Transfers Overall transfer level: Needs assistance Equipment used: Rolling walker (2 wheeled) Transfers: Sit to/from [facility] Sit to Stand: Min guard Stand pivot transfers: Min guard       General transfer comment: Pt steady with transfers with min verbal cues for sequencing  Ambulation/Gait Ambulation/Gait assistance: Min  guard Ambulation Distance (Feet): 10 Feet Assistive device: Rolling walker (2 wheeled)     Gait velocity interpretation: Below normal speed for age/gender General Gait Details: Hop-to gait with RW with min verbal and visual cues for proper sequencing; knee scooter training provided with CGA 200+ feet including proper use of breaks, forwards/backwards mobility, and transfers on/off scooter  Stairs            Wheelchair Mobility    Modified Rankin (Stroke Patients Only)       Balance Overall balance assessment: Needs assistance Sitting-balance support: No upper extremity supported Sitting balance-Leahy Scale: Normal     Standing balance support: Single extremity supported Standing balance-Leahy Scale: Good                               Pertinent Vitals/Pain Pain Assessment: 0-10 Pain Score: 3  Pain Location: L ankle Pain Descriptors / Indicators: Aching Pain Intervention(s): Monitored during session    Home Living Family/patient expects to be discharged to:: Private residence Living Arrangements: Alone Available Help at Discharge: Family;Friend(s);Available PRN/intermittently Type of Home: House Home Access: Stairs to enter Entrance Stairs-Rails: Right Entrance Stairs-Number of Steps: 4 Home Layout: One level Home Equipment: Walker - 2 wheels;Other (comment) (Will borrow a knee scooter)      Prior Function Level of Independence: Independent         Comments: Ind amb community distances with 3 total falls in last year secondary to tripping, active in the community, Ind with ADLs     Hand Dominance  Extremity/Trunk Assessment   Upper Extremity Assessment Upper Extremity Assessment: Overall WFL for tasks assessed    Lower Extremity Assessment Lower Extremity Assessment: Generalized weakness       Communication   Communication: No difficulties  Cognition Arousal/Alertness: Awake/alert Behavior During Therapy: WFL for tasks  assessed/performed Overall Cognitive Status: Within Functional Limits for tasks assessed                      General Comments      Exercises Total Joint Exercises Ankle Circles/Pumps: AROM;Right;Other (comment) (2 x 12 reps) Quad Sets: AROM;Both;Other (comment) (x 12 reps) Gluteal Sets: AROM;Both;Other (comment) (x 12 reps) Heel Slides: AROM;Right;5 reps Hip ABduction/ADduction: Strengthening;Both;Other (comment) (x 8 reps) Straight Leg Raises: AROM;Both;Other (comment) (x 12 reps) Long Arc Quad: AROM;Left;Other (comment) (x 12 reps) Knee Flexion: AROM;Left;Other (comment) (x 12 reps)   Assessment/Plan    PT Assessment Patient needs continued PT services  PT Problem List Decreased strength;Decreased activity tolerance;Decreased balance;Decreased knowledge of use of DME       PT Treatment Interventions DME instruction;Gait training;Stair training;Functional mobility training;Neuromuscular re-education;Balance training;Therapeutic activities;Therapeutic exercise;Patient/family education    PT Goals (Current goals can be found in the Care Plan section)  Acute Rehab PT Goals Patient Stated Goal: To get back home PT Goal Formulation: With patient Time For Goal Achievement: 03/11/16 Potential to Achieve Goals: Good    Frequency BID   Barriers to discharge        Co-evaluation               End of Session Equipment Utilized During Treatment: Gait belt Activity Tolerance: Patient tolerated treatment well;No increased pain Patient left: in chair;with call bell/phone within reach Nurse Communication: Mobility status PT Visit Diagnosis: Other abnormalities of gait and mobility (R26.89);Muscle weakness (generalized) (M62.81)         Time: [id number] PT Time Calculation (min) (ACUTE ONLY): 50 min   Charges:   PT Evaluation $PT Eval Low Complexity: 1 Procedure PT Treatments $Gait Training: 8-22 mins $Therapeutic Exercise: 8-22 mins   PT G Codes:          DRoyetta Asal PT, DPT 02/27/16, 12:22 PM

## 2016-02-27 NOTE — Progress Notes (Addendum)
Subjective:  Postoperative day 1 status post ORIF of left ankle. Patient reports pain as mild to moderate.  No acute issues. Patient is up out of bed to a chair. Patient is seen today with Marjory Lies, her RN,  at the bedside.  Objective:   VITALS:   Vitals:   02/26/16 2210 02/26/16 2310 02/27/16 0405 02/27/16 0734  BP: (!) 132/47 102/69 (!) 121/48 (!) 120/57  Pulse: 61 82 78 72  Resp: 18 18 18    Temp: 98.1 F (36.7 C) 98.5 F (36.9 C) 98.2 F (36.8 C) 98.5 F (36.9 C)  TempSrc: Oral Oral Oral Oral  SpO2: 100% 100% 99% 96%  Weight:      Height:        PHYSICAL EXAM:  Left lower extremity: Patient's dressing remains clean and dry. She can flex and extend her toes. She has intact sensation light touch in all 5 toes and the first dorsal webspace. Her toes are well-perfused.    LABS  Results for orders placed or performed during the hospital encounter of 02/25/16 (from the past 24 hour(s))  CBC     Status: Abnormal   Collection Time: 02/27/16  4:57 AM  Result Value Ref Range   WBC 8.1 3.6 - 11.0 K/uL   RBC 3.45 (L) 3.80 - 5.20 MIL/uL   Hemoglobin 11.0 (L) 12.0 - 16.0 g/dL   HCT 33.0 (L) 35.0 - 47.0 %   MCV 95.6 80.0 - 100.0 fL   MCH 32.0 26.0 - 34.0 pg   MCHC 33.5 32.0 - 36.0 g/dL   RDW 13.3 11.5 - 14.5 %   Platelets 201 150 - 440 K/uL  Basic metabolic panel     Status: Abnormal   Collection Time: 02/27/16  4:57 AM  Result Value Ref Range   Sodium 139 135 - 145 mmol/L   Potassium 3.2 (L) 3.5 - 5.1 mmol/L   Chloride 109 101 - 111 mmol/L   CO2 24 22 - 32 mmol/L   Glucose, Bld 165 (H) 65 - 99 mg/dL   BUN 7 6 - 20 mg/dL   Creatinine, Ser 0.68 0.44 - 1.00 mg/dL   Calcium 7.9 (L) 8.9 - 10.3 mg/dL   GFR calc non Af Amer >60 >60 mL/min   GFR calc Af Amer >60 >60 mL/min   Anion gap 6 5 - 15    Dg Ankle Complete Left  Result Date: 02/26/2016 CLINICAL DATA:  Status post fixation of left ankle fractures today. Initial encounter. EXAM: LEFT ANKLE COMPLETE - 3+ VIEW  COMPARISON:  CT left ankle 02/25/2016. FINDINGS: The patient is in a new fiberglass cast. Lateral plate and screws and interfragmentary screws are in place for fixation of a distal fibular fracture. Two screws are seen in both the posterior and medial malleolus for fracture fixation. Position and alignment are anatomic. No acute abnormality is identified. IMPRESSION: Status post fixation of trimalleolar fractures without evidence of complication. Electronically Signed   By: Inge Rise M.D.   On: 02/26/2016 17:25   Dg Ankle Complete Left  Result Date: 02/25/2016 CLINICAL DATA:  Trimalleolar ankle fracture. EXAM: LEFT ANKLE COMPLETE - 3+ VIEW COMPARISON:  Earlier the same day. FINDINGS: Posterior dislocation of the talus is been reduced in the interval. There is persistent lateral subluxation of the talar dome relative to the distal tibia. Comminuted distal fibula fracture noted with medial malleolus fracture the distal tibia and posterior lip distal tibia fracture associated. IMPRESSION: Interval reduction of tibiotalar dislocation although lateral subluxation of the  talus relative to the tibia persists. Electronically Signed   By: Misty Stanley M.D.   On: 02/25/2016 21:50   Dg Ankle Complete Left  Result Date: 02/25/2016 CLINICAL DATA:  Status post attempted reduction of left trimalleolar ankle fracture/dislocation. Initial encounter. EXAM: LEFT ANKLE COMPLETE - 3+ VIEW COMPARISON:  None. FINDINGS: There is improved alignment of the comminuted trimalleolar fracture of the left ankle. However, there is persistent posterior dislocation of the talus, lodged against the posterior malleolar fracture site. Significantly posteriorly displaced distal fibular and posterior malleolar fragments are again noted, with a mildly displaced medial malleolar fracture. No new fractures are seen. Surrounding soft tissue swelling is noted. The subtalar joint is grossly unremarkable. IMPRESSION: Persistent posterior  dislocation of the talus, lodged against the posterior malleolar fracture site. Significantly posteriorly displaced distal fibular and posterior malleolar fragments again noted, with a mildly displaced medial malleolar fracture. Alignment is somewhat improved from the prior study. These results were called by telephone at the time of interpretation on 02/25/2016 at 8:05 pm to Nursing at the [facility], who verbally acknowledged these results. Electronically Signed   By: Garald Balding M.D.   On: 02/25/2016 20:05   Dg Ankle Complete Left  Result Date: 02/25/2016 CLINICAL DATA:  Patient fell down stairs. Left ankle pain and deformity. EXAM: LEFT ANKLE COMPLETE - 3+ VIEW COMPARISON:  None. FINDINGS: Comminuted distal fibular fracture is associated with posterior lip fracture of the distal tibia and medial malleolar fracture. There is medial dislocation of the distal tibia relative to the talar dome. IMPRESSION: Comminuted trimalleolar fracture dislocation. Electronically Signed   By: Misty Stanley M.D.   On: 02/25/2016 17:28   Pelvis Portable  Result Date: 02/26/2016 CLINICAL DATA:  Fall down stairs. Trimalleolar fracture status post ORIF. EXAM: PORTABLE PELVIS 1-2 VIEWS COMPARISON:  None. FINDINGS: There is no evidence of pelvic fracture or diastasis. No focal osseous lesion is seen. Hip joint space widths are preserved. Clips are present in the left pelvis. IMPRESSION: No evidence of acute osseous abnormality. Electronically Signed   By: Logan Bores M.D.   On: 02/26/2016 19:10   Ct Ankle Left Wo Contrast  Result Date: 02/25/2016 CLINICAL DATA:  66 year old female with left ankle fracture dislocation. EXAM: CT OF THE LEFT ANKLE WITHOUT CONTRAST TECHNIQUE: Multidetector CT imaging of the left ankle was performed according to the standard protocol. Multiplanar CT image reconstructions were also generated. COMPARISON:  Left ankle radiographs dated 02/25/2016 FINDINGS: Bones/Joint/Cartilage There is  a displaced and comminuted fracture of the distal fibula. There is extension of the fracture fragment into the lateral tibiotalar articulation. There is displaced corner fracture of the lateral aspect of the distal tibia at the talotibial articulation. There is fracture of the medial malleolus with approximately 5 mm lateral displacement of the distal fracture fragment. There is mildly displaced comminuted fracture of the posterior malleolus. No other acute fracture identified. The bones are osteopenic. There is approximately 5 mm lateral subluxation of the talus in relation to the tibia. Ligaments Suboptimally assessed by CT. Muscles and Tendons No significant intramuscular hematoma. No definite tendon injury by CT. The Achilles tendon is intact. Soft tissues There is diffuse subcutaneous soft tissue edema as well as edema and contusion of the deeper fascia layer. A 15 mm rounded low attenuating intermuscular focus in the posterior compartment of the distal leg demonstrates fat attenuation. IMPRESSION: 1. Comminuted and displaced fracture of the distal fibula as well as fractures of the posterior and medial malleoli. There is  also mildly displaced corner fracture of the lateral distal tibia at the talotibial articulation. 2. Approximately 5 mm lateral subluxation of the talus in relation to the tibia. Electronically Signed   By: Anner Crete M.D.   On: 02/25/2016 22:57   Ct 3d Recon At Scanner  Result Date: 02/25/2016 CLINICAL DATA:  66 year old female with left ankle fracture dislocation. EXAM: CT OF THE LEFT ANKLE WITHOUT CONTRAST TECHNIQUE: Multidetector CT imaging of the left ankle was performed according to the standard protocol. Multiplanar CT image reconstructions were also generated. COMPARISON:  Left ankle radiographs dated 02/25/2016 FINDINGS: Bones/Joint/Cartilage There is a displaced and comminuted fracture of the distal fibula. There is extension of the fracture fragment into the lateral  tibiotalar articulation. There is displaced corner fracture of the lateral aspect of the distal tibia at the talotibial articulation. There is fracture of the medial malleolus with approximately 5 mm lateral displacement of the distal fracture fragment. There is mildly displaced comminuted fracture of the posterior malleolus. No other acute fracture identified. The bones are osteopenic. There is approximately 5 mm lateral subluxation of the talus in relation to the tibia. Ligaments Suboptimally assessed by CT. Muscles and Tendons No significant intramuscular hematoma. No definite tendon injury by CT. The Achilles tendon is intact. Soft tissues There is diffuse subcutaneous soft tissue edema as well as edema and contusion of the deeper fascia layer. A 15 mm rounded low attenuating intermuscular focus in the posterior compartment of the distal leg demonstrates fat attenuation. IMPRESSION: 1. Comminuted and displaced fracture of the distal fibula as well as fractures of the posterior and medial malleoli. There is also mildly displaced corner fracture of the lateral distal tibia at the talotibial articulation. 2. Approximately 5 mm lateral subluxation of the talus in relation to the tibia. Electronically Signed   By: Anner Crete M.D.   On: 02/25/2016 22:57   Chest Portable 1 View  Result Date: 02/26/2016 CLINICAL DATA:  Preoperative evaluation prior to ankle surgery. EXAM: PORTABLE CHEST 1 VIEW COMPARISON:  None. FINDINGS: The cardiac and mediastinal silhouettes are within normal limits. Mild aortic atherosclerosis. The lungs are normally inflated. No airspace consolidation, pleural effusion, or pulmonary edema is identified. There is no pneumothorax. No acute osseous abnormality identified. IMPRESSION: 1. No active cardiopulmonary disease. 2. Aortic atherosclerosis. Electronically Signed   By: Jeannine Boga M.D.   On: 02/26/2016 02:14    Assessment/Plan: 1 Day Post-Op   Active Problems:    Fracture dislocation of ankle joint, left, closed, initial encounter  Patient doing well postop. She will complete 24 as of postop antibiotics. She will continue physical and occupational therapy. Patient is on Lovenox for DVT prophylaxis. I anticipate discharge home tomorrow with home physical therapy.  Potassium repleted by the hospitalist. Appreciate hospitalist input.    Thornton Park , MD 02/27/2016, 11:57 AM

## 2016-02-27 NOTE — Evaluation (Signed)
Occupational Therapy Evaluation Patient Details Name: Neville Aguero MRN: [id number] DOB: 08-Feb-1950 Today's Date: 02/27/2016    History of Present Illness Pt is a 66 y/o female with history of depression and hyperlipidemia who presents after a mechanical fall and now has suffered a left ankle fracture dislocation and is s/p L ankle ORIF, POD#1 for OT evaluation.   Clinical Impression   Pt seen for OT evaluation this date. Pt is 66 year old female s/p L ankle ORIF.  Pt was independent in all ADLs prior to surgery with a history of repeated falls (3 in past 12 months), and is eager to return to PLOF.  Pt currently requires minimal assist for LB ADL tasks while in seated position due to pain and non weight bearing status of LLE.  Pt would benefit from additional instruction in dressing techniques with or without assistive devices for dressing and bathing skills.  Pt educated in recommendations for home modifications and rountines modifications to increase safety in the home and prevent falls and need to rush to the bathroom due to bladder urgency. Pt would benefit from [facility] upon discharge in order maximize return to PLOF and minimize future falls risk.       Follow Up Recommendations  Home health OT    Equipment Recommendations  3 in 1 bedside commode    Recommendations for Other Services       Precautions / Restrictions Precautions Precautions: Fall Restrictions Weight Bearing Restrictions: Yes LLE Weight Bearing: Non weight bearing      Mobility Bed Mobility Overal bed mobility: Independent             General bed mobility comments: did not assess, pt up in recliner at start of OT session; defer to PT evaluation note  Transfers Overall transfer level: Needs assistance       General transfer comment: did not assess, pt up in recliner at start of OT session, declined out of recliner due to fatigue; defer to PT evaluation note    Balance Overall balance  assessment: Needs assistance;History of Falls Sitting-balance support: No upper extremity supported Sitting balance-Leahy Scale: Normal                                  ADL Overall ADL's : Needs assistance/impaired Eating/Feeding: Sitting;Set up   Grooming: Set up;Sitting   Upper Body Bathing: Sitting;Set up   Lower Body Bathing: Minimal assistance;With adaptive equipment;Sitting/lateral leans;Sit to/from stand;Set up   Upper Body Dressing : Set up;Sitting   Lower Body Dressing: Set up;Sitting/lateral leans;Sit to/from stand;Minimal assistance;With adaptive equipment Lower Body Dressing Details (indicate cue type and reason): cueing for precautions, educated in use of AE for LB dressing tasks    Toilet Transfer Details (indicate cue type and reason): pt declined during session, stating she had recently gone using BSC next to bed with nsg while maintaining NWB status on LLE           General ADL Comments: generally min assist for LB ADL, would benefit from additional training in AE and compensatory strategies for LB dressing      Vision Baseline Vision/History: Wears glasses Wears Glasses: At all times (also has reading glasses) Patient Visual Report: No change from baseline Vision Assessment?: No apparent visual deficits     Perception     Praxis Praxis Praxis tested?: Within functional limits    Pertinent Vitals/Pain Pain Assessment: 0-10 Pain Score: 3  Pain  Location: L ankle Pain Descriptors / Indicators: Aching Pain Intervention(s): Monitored during session;Premedicated before session (premedicated prior to earlier PT evaluation)     Hand Dominance     Extremity/Trunk Assessment Upper Extremity Assessment Upper Extremity Assessment: Overall WFL for tasks assessed (does report 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 that are difficult)   Lower Extremity Assessment Lower Extremity  Assessment: Defer to PT evaluation;LLE deficits/detail LLE Deficits / Details: NWB'ing LLE   Cervical / Trunk Assessment Cervical / Trunk Assessment: Normal   Communication Communication Communication: No difficulties   Cognition Arousal/Alertness: Awake/alert Behavior During Therapy: WFL for tasks assessed/performed Overall Cognitive Status: Within Functional Limits for tasks assessed                     General Comments       Exercises   Other Exercises Other Exercises: Pt educated in use of ECS, home modifications and rountines modifications (including proper footwear) to minimize falls risk and maximize safety and independence Other Exercises: Pt educated in incontinence/urgency problem solving and compensatory strategies including a toileting schedule, cognitive/mindfulness training, and incontinence products and use of BSC overnight while NWBing on LLE to maximize independence and minimize risk of bladder leakage, accidents, and need to rush to the bathroom (pt reports this happens "all the time") which increases falls risk   Shoulder Instructions      Home Living Family/patient expects to be discharged to:: Private residence Living Arrangements: Alone Available Help at Discharge: Family;Friend(s);Available PRN/intermittently (pt reports enough help throughout day with brief periods by herself) Type of Home: House Home Access: Stairs to enter [employer] of Steps: 4 Entrance Stairs-Rails: Right Home Layout: One level     Bathroom Shower/Tub: Tub/shower unit Shower/tub characteristics: Curtain Biochemist, clinical: Handicapped height Bathroom Accessibility: No   Home Equipment: Environmental consultant - 2 wheels;Other (comment);Shower seat (has access to borrow knee scooter, shower chair)          Prior Functioning/Environment Level of Independence: Independent        Comments: Ind amb community distances with 3 total falls in last year secondary to tripping ("I  know I'm clumsy; I always have been"), active in the community, 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, Ind with ADLs, med [place], home mgt, and enjoys playing video games        OT Problem List: Decreased strength;Pain;Decreased range of motion;Decreased activity tolerance;Decreased knowledge of use of DME or AE      OT Treatment/Interventions: Self-care/ADL training;Therapeutic exercise;Therapeutic activities;Energy conservation;DME and/or AE instruction;Patient/family education    OT Goals(Current goals can be found in the care plan section) Acute Rehab OT Goals Patient Stated Goal: go home OT Goal Formulation: With patient Time For Goal Achievement: 03/12/16 Potential to Achieve Goals: Good  OT Frequency: Min 1X/week   Barriers to D/C: Inaccessible home environment  Will need to negotiate 4 STE home       Co-evaluation              End of Session    Activity Tolerance: Patient tolerated treatment well Patient left: in chair;with call bell/phone within reach;with chair alarm set  OT Visit Diagnosis: Unsteadiness on feet (R26.81);Repeated falls (R29.6);History of falling (Z91.81);Muscle weakness (generalized) (M62.81);Pain Pain - Right/Left: Left Pain - part of body: Ankle and joints of foot                ADL either performed or assessed with clinical judgement  Time: [id number] OT Time Calculation (  min): 43 min Charges:  OT General Charges $OT Visit: 1 Procedure OT Evaluation $OT Eval Low Complexity: 1 Procedure OT Treatments $Self Care/Home Management : 23-37 mins G-Codes:     Jeni Salles, MPH, MS, OTR/L ascom [phone] 02/27/16, 12:45 PM

## 2016-02-27 NOTE — Care Management (Signed)
PT pending, following.  Please clarify home Lovenox orders

## 2016-02-27 NOTE — Care Management Note (Signed)
Case Management Note  Patient Details  Name: Margaret Warner MRN: [id number] Date of Birth: 05-Oct-1950  Subjective/Objective:  Case discussed with ortho. Patient will not be discharged home on Lovenox. Met with patient at bedside to discuss discharge plan.  She is agreeable to home health services with no agency preference. Referral to [facility] for HHPT.  Patient will have a walker and knee scooter that she is borrowing. She will need a 3 in 1 and a wheelchair. Ordered from [facility].                  Action/Plan:   Expected Discharge Date:                  Expected Discharge Plan:  [place]  In-House Referral:     Discharge planning Services  CM Consult  Post Acute Care Choice:  Durable Medical Equipment, Home Health Choice offered to:  Patient  DME Arranged:  3-N-1 DME Agency:     HH Arranged:  PT [place]:  [place]  Status of Service:  In process, will continue to follow  If discussed at Long Length of Stay Meetings, dates discussed:    Additional Comments:  Jolly Mango, RN 02/27/2016, 12:10 PM

## 2016-02-27 NOTE — Progress Notes (Signed)
Physical Therapy Treatment Patient Details Name: Margaret Warner MRN: [id number] DOB: 21-Aug-1950 Today's Date: 02/27/2016    History of Present Illness Pt is a 66 y/o female with history of depression and hyperlipidemia who presents after a mechanical fall and now has suffered a left ankle fracture dislocation and is s/p L ankle ORIF, POD#1 for OT evaluation.    PT Comments    Pt presents with deficits in strength, transfers, gait, balance, and activity tolerance but is overall doing very well and progressing quickly towards goals.  Pt able to amb with hop-to gait 2 x 15' and 2 x 10' with CGA and minimal cues for sequencing without LOB while maintaining WB status throughout.  Knee scooter training with pt's borrowed knee scooter 1 x 150', 1 x 200' with SBA and min verbal cues for general safety.  Pt able to transfer to/from knee scooter and other surfaces with SBA and min verbal cues for sequencing.  Extensive step training this session with attempts at hopping with BUEs on R rail, use of one rail and one crutch, and ascending/descending stairs while sitting on steps.  Pt unable to hop up steps with BUEs on one rail.  Pt able to ascend/descend 4 steps with one rail and one crutch with Min A but reports not feeling safe doing so.  Pt able to ascend/descend 4 steps in sitting position with min-mod A to get back to standing at top of steps with R foot remaining one step down from the top landing.  Pt most comfortable with this method.  Pt will benefit from continued PT services to address above deficits for decreased caregiver assistance upon discharge.      Follow Up Recommendations  Home health PT     Equipment Recommendations  Wheelchair (measurements PT)    Recommendations for Other Services       Precautions / Restrictions Precautions Precautions: Fall Restrictions Weight Bearing Restrictions: Yes LLE Weight Bearing: Non weight bearing    Mobility  Bed Mobility Overal bed  mobility: Independent             General bed mobility comments: did not assess, pt up in recliner at start of OT session; defer to PT evaluation note  Transfers Overall transfer level: Needs assistance Equipment used: Rolling walker (2 wheeled) Transfers: Sit to/from [facility] Sit to Stand: Supervision Stand pivot transfers: Supervision       General transfer comment: Pt steady and confident with sit to/from stand and stand pivot transfers from various surfaces  Ambulation/Gait Ambulation/Gait assistance: Min guard Ambulation Distance (Feet): 15 Feet Assistive device: Rolling walker (2 wheeled)     Gait velocity interpretation: Below normal speed for age/gender General Gait Details: Hop-to gait with RW with CGA and with min verbal cues for proper sequencing 2 x 15', 2 x 5'; knee scooter training provided with CGA 200+ feet including proper use of breaks, forwards/backwards mobility, and transfers on/off scooter   Stairs Stairs: Yes   Stair Management: One rail Right;Seated/boosting;With crutches Number of Stairs: 4 General stair comments: Extensive step training with attempts at hopping with BUEs on R rail, use of one rail and one crutch, and ascending/descending while sitting on steps.    Wheelchair Mobility    Modified Rankin (Stroke Patients Only)       Balance Overall balance assessment: Needs assistance Sitting-balance support: No upper extremity supported Sitting balance-Leahy Scale: Normal     Standing balance support: Single extremity supported Standing balance-Leahy Scale: Good  Cognition Arousal/Alertness: Awake/alert Behavior During Therapy: WFL for tasks assessed/performed Overall Cognitive Status: Within Functional Limits for tasks assessed                      Exercises Total Joint Exercises Ankle Circles/Pumps: AROM;Right;Other (comment) (2 x 12 reps) Quad Sets: AROM;Both;Other  (comment) (x 12 reps) Gluteal Sets: AROM;Both;Other (comment) (x 12 reps) Heel Slides: AROM;Right;5 reps Hip ABduction/ADduction: Strengthening;Both;Other (comment) (x 8 reps) Straight Leg Raises: AROM;Both;Other (comment) (x 12 reps) Long Arc Quad: AROM;Left;Other (comment) (x 12 reps) Knee Flexion: AROM;Left;Other (comment) (x 12 reps) Other Exercises Other Exercises: Pt educated in use of ECS, home modifications and rountines modifications (including proper footwear) to minimize falls risk and maximize safety and independence Other Exercises: Pt educated in incontinence/urgency problem solving and compensatory strategies including a toileting schedule, cognitive/mindfulness training, and incontinence products and use of BSC overnight while NWBing on LLE to maximize independence and minimize risk of bladder leakage, accidents, and need to rush to the bathroom (pt reports this happens "all the time") which increases falls risk    General Comments        Pertinent Vitals/Pain Pain Assessment: 0-10 Pain Score: 3  Pain Location: L ankle Pain Descriptors / Indicators: Aching Pain Intervention(s): Monitored during session;Premedicated before session    Home Living Family/patient expects to be discharged to:: Private residence Living Arrangements: Alone Available Help at Discharge: Family;Friend(s);Available PRN/intermittently (pt reports enough help throughout day with brief periods by herself) Type of Home: House Home Access: Stairs to enter Entrance Stairs-Rails: Right Home Layout: One level Home Equipment: Walker - 2 wheels;Other (comment);Shower seat (has access to borrow knee scooter, shower chair)      Prior Function Level of Independence: Independent      Comments: Ind amb community distances with 3 total falls in last year secondary to tripping ("I know I'm clumsy; I always have been"), active in the community, 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, Ind with ADLs, med [place], home mgt, and  enjoys playing video games   PT Goals (current goals can now be found in the care plan section) Acute Rehab PT Goals Patient Stated Goal: go home PT Goal Formulation: With patient Time For Goal Achievement: 03/11/16 Potential to Achieve Goals: Good    Frequency    BID      PT Plan Current plan remains appropriate    Co-evaluation             End of Session Equipment Utilized During Treatment: Gait belt Activity Tolerance: Patient tolerated treatment well;No increased pain Patient left: in bed;with call bell/phone within reach Nurse Communication: Mobility status PT Visit Diagnosis: Other abnormalities of gait and mobility (R26.89);Muscle weakness (generalized) (M62.81)     Time: [id number] PT Time Calculation (min) (ACUTE ONLY): 41 min  Charges:  $Gait Training: 38-52 mins $Therapeutic Exercise: 8-22 mins                    G Codes:       D. Royetta Asal PT, DPT 02/27/16, 3:31 PM

## 2016-02-28 LAB — BASIC METABOLIC PANEL
Anion gap: 4 — ABNORMAL LOW (ref 5–15)
BUN: 8 mg/dL (ref 6–20)
CALCIUM: 8.4 mg/dL — AB (ref 8.9–10.3)
CO2: 27 mmol/L (ref 22–32)
CREATININE: 0.41 mg/dL — AB (ref 0.44–1.00)
Chloride: 112 mmol/L — ABNORMAL HIGH (ref 101–111)
GFR calc Af Amer: >60 mL/min (ref >60)
GFR calc non Af Amer: >60 mL/min (ref >60)
GLUCOSE: 104 mg/dL — AB (ref 65–99)
POTASSIUM: 3.9 mmol/L (ref 3.5–5.1)
SODIUM: 143 mmol/L (ref 135–145)

## 2016-02-28 LAB — CBC
HCT: 31.3 % — ABNORMAL LOW (ref 35.0–47.0)
Hemoglobin: 10.9 g/dL — ABNORMAL LOW (ref 12.0–16.0)
MCH: 32.2 pg (ref 26.0–34.0)
MCHC: 34.9 g/dL (ref 32.0–36.0)
MCV: 92.4 fL (ref 80.0–100.0)
PLATELETS: 219 10*3/uL (ref 150–440)
RBC: 3.39 MIL/uL — ABNORMAL LOW (ref 3.80–5.20)
RDW: 13.4 % (ref 11.5–14.5)
WBC: 7.7 10*3/uL (ref 3.6–11.0)

## 2016-02-28 MED ORDER — OXYCODONE HCL 5 MG PO TABS: 5.0000 mg | ORAL_TABLET | ORAL | 0 refills | Status: DC | PRN

## 2016-02-28 NOTE — Op Note (Signed)
02/25/2016 - 02/26/2016  12:30 PM  PATIENT:  Margaret Warner    PRE-OPERATIVE DIAGNOSIS:  LEFT ANKLE FRACTURE DISLOCATION  POST-OPERATIVE DIAGNOSIS:  Same  PROCEDURE:  OPEN REDUCTION INTERNAL FIXATION (ORIF) ANKLE FRACTURE DISLOCATION  SURGEON:  KRASINSKI, KEVIN, MD  ANESTHESIA:   General  PREOPERATIVE INDICATIONS:  Margaret Warner is a  66 y.o. female with a diagnosis of LEFT ANKLE FRACTURE DISLCOATION.  Given the displacement and instability to the left ankle, I recommended an open reduction internal fixation.   I discussed the risks and benefits of surgery. The risks include but are not limited to infection, bleeding requiring blood transfusion, nerve or blood vessel injury, joint stiffness or loss of motion, persistent pain, weakness or instability, malunion, nonunion and hardware failure and the need for further surgery. Medical risks include but are not limited to DVT and pulmonary embolism, myocardial infarction, stroke, pneumonia, respiratory failure and death. Patient understood these risks and wished to proceed.   OPERATIVE IMPLANTS: Synthes 10 hole one third tubular plate, 4 x 4.0 Synthes cannulated screws  OPERATIVE FINDINGS: Trimalleolar ankle fracture dislocation.  OPERATIVE PROCEDURE:   Patient was met in the preoperative area. The left leg was signed my initials and the word yes according the hospital's correct site of surgery protocol. The patient was brought to the operating room where she underwent general anesthesia. The patient was placed supine on the operative table. A bump was placed under the left hip. A tourniquet was applied to the left thigh.  The lower extremity was prepped and draped in a sterile fashion. A timeout was performed to verify the patient's name, date of birth, medical record number, correct site of surgery and correct procedure to be performed. It was also used to verify the patient received antibiotics, and that all appropriate  instruments, implants and radiographic studies were available in the room. Once all in attendance were in agreement, the case began.  The left lower extremity was exsanguinated with an Esmarch. The tourniquet was inflated to 250 mmHg. This was applied for a total of 125 minutes. A lateral incision was made over the fibula. The subcutaneous tissues were dissected with the Metzenbaum scissor and pickup. Care was taken to avoid injury to the superficial peroneal nerve. The lateral malleolus fracture was identified and irrigated and fracture hematoma was removed. Soft tissue was removed from the fracture site using a periosteal elevator. A fracture reduction clamp was then used to reduce the fracture to an anatomic position.     The lateral malleolus was then drilled in an AP direction, perpendicular to the fracture site to allow for placement of the 2 lag screws.   Two lag screws were advanced across the fracture site by hand. This compressed the fracture.   A 10 hole, 1/3 tubular plate was then contoured and placed along the lateral fibula. Bicortical screws were placed proximal to the fracture and fully threaded cancellus screws were placed distal the fracture. The fracture reduction and hardware placement were confirmed on AP and lateral imaging.  Once the lateral malleolus was plated, the attention was turned to the medial ankle. A small vertical incision was made over the tip of the medial malleolus.  Soft tissue was dissected with some with the Metzenbaum scissor and pickup. The fracture of the medial malleolus was identified. This was reduced with a dental pick. 2 threaded K wires for the 4.0 cannulated screws were then advanced through the tip of the medial malleolus across the fracture site and into   the distal tibia. The position of the K wires was evaluated on AP and lateral FluoroScan images. The length of the wires were measured with a depth gauge and were determined to be 40 and  40mm in length. The  wires were then overdrilled with a cannulated drill for the 4.0 cannulated screws. The two long threaded 4.0 cannulated screws were then advanced into position by hand, compressing the medial malleolus fracture.    The posterior malleolus was then examined under fluoroscopy.  The posterior malleolus was greater then 20-25% of the articular surface. The decision was made to fix the posterior malleolus.  Two K wires were advanced from the posterior malleolus across the fracture into the tibia.  The position of these K wires was confirmed on AP and lateral ankle fluoroscopic views. Once the K wires were in good position they were measured with a depth gauge and overdrilled. Two 4.0 cannulated screws were then advanced across the fracture site from a posterior to anterior direction by hand.  The final position of the screws was confirmed on fluoroscopic imaging.   A stress test of the right ankle was then performed under fluoroscopy.  This test did not reveal any syndesmotic injury or opening of the medial clear space.  Mortise was symmetric.  There was no talar subluxation seen.  The medial and lateral incisions were then copiously irrigated. The subcutaneous tissue was closed with 2-0 Vicryl and the skin approximated staples. A dry sterile dressing was applied along with an AO splint. The patient's ankle was positioned in neutral. The pateint was then awoken from anesthesia, transferred to hospital bed and brought to the PACU in stable condition. I was scrubbed and present the entire case and all sharp and instrument counts were correct at conclusion the case. I spoke to the patient's friend in the post-op consultation room to let her know the case was performed without complication and the patient was stable in recovery room.    Kevin L. Krasinski, MD 

## 2016-02-28 NOTE — Progress Notes (Signed)
Pt being discharged home today. PIV removed. Discharge instructions reviewed with pt, all questions answered. Prescriptions were given to patient to have filled at her pharmacy. She will follow up at Dr. Harden Mo office. Home health has been set up and she has all needed DME. She is leaving with all her belongings, will be transported home via family member.

## 2016-02-28 NOTE — Progress Notes (Signed)
Physical Therapy Treatment Patient Details Name: Margaret Warner MRN: [id number] DOB: 04/30/1950 Today's Date: 02/28/2016    History of Present Illness Pt is a 65 y/o female with history of depression and hyperlipidemia who presents after a mechanical fall and now has suffered a left ankle fracture dislocation and is s/p L ankle ORIF.    PT Comments    Pt has met goals and feels ready for discharge today. Pt very motivated to perform therapy and was given a written HEP to perform at home. Pt completed stair training yesterday and feels confident entering/exiting home. Safe technique with all mobility this date and use of knee scooter.   Follow Up Recommendations  Home health PT     Equipment Recommendations  Wheelchair (measurements PT)    Recommendations for Other Services       Precautions / Restrictions Precautions Precautions: Fall Restrictions Weight Bearing Restrictions: Yes LLE Weight Bearing: Non weight bearing    Mobility  Bed Mobility Overal bed mobility: Independent             General bed mobility comments: Pt performs safe technique with bed mobility.  Transfers Overall transfer level: Needs assistance Equipment used: None Transfers: Stand Pivot Transfers Sit to Stand: Supervision Stand pivot transfers: Supervision       General transfer comment: Pt performed stand pivot to knee scooter and back to bed with safe technique while maintaining correct WB status.  Ambulation/Gait Ambulation/Gait assistance: Supervision Ambulation Distance (Feet): 200 Feet Assistive device:  (knee scooter)       General Gait Details: Pt performed gait training using knee scooter with safe technique. Pt educated to take time performing turns as to not get off balance. Safe speed noted.   Stairs            Wheelchair Mobility    Modified Rankin (Stroke Patients Only)       Balance                                    Cognition  Arousal/Alertness: Awake/alert Behavior During Therapy: WFL for tasks assessed/performed Overall Cognitive Status: Within Functional Limits for tasks assessed                      Exercises Other Exercises Other Exercises: Gave pt written copy of HEP and reviewed. Pt performed B LE when appropriate and performed ankle pumps (R side), quad sets, hip add squeezes, SAQ, SLRs, and hip abd/add. Pt performed x 12 reps and encouraged to continue performance at home.    General Comments        Pertinent Vitals/Pain Pain Assessment: Faces Faces Pain Scale: Hurts a little bit Pain Location: L ankle Pain Descriptors / Indicators: Aching Pain Intervention(s): Limited activity within patient's tolerance    Home Living                      Prior Function            PT Goals (current goals can now be found in the care plan section) Acute Rehab PT Goals Patient Stated Goal: go home PT Goal Formulation: With patient Time For Goal Achievement: 03/11/16 Potential to Achieve Goals: Good Progress towards PT goals: Progressing toward goals    Frequency    BID      PT Plan Current plan remains appropriate    Co-evaluation               End of Session   Activity Tolerance: Patient tolerated treatment well;No increased pain Patient left: in bed;with call bell/phone within reach Nurse Communication: Mobility status PT Visit Diagnosis: Other abnormalities of gait and mobility (R26.89);Muscle weakness (generalized) (M62.81)     Time: 3244-0102 PT Time Calculation (min) (ACUTE ONLY): 28 min  Charges:  $Gait Training: 8-22 mins $Therapeutic Exercise: 8-22 mins                    G Codes:       Yoshino Broccoli 09-Mar-2016, 11:46 AM  Greggory Stallion, PT, DPT [phone]

## 2016-02-28 NOTE — Care Management Important Message (Signed)
Important Message  Patient Details  Name: Oshyn Metro MRN: [id number] Date of Birth: [birth year]   Medicare Important Message Given:  Yes    Jolly Mango, RN 02/28/2016, 2:50 PM

## 2016-02-28 NOTE — Care Management (Signed)
Patient has declined a wheelchair from [facility]

## 2016-02-28 NOTE — Progress Notes (Signed)
Subjective:  Septic #2 status post ORIF of left ankle fracture dislocation. Patient reports pain as mild.  Patient is doing well. Her brothers at the bedside.  Objective:   VITALS:   Vitals:   02/27/16 1335 02/27/16 1943 02/28/16 0415 02/28/16 0817  BP: (!) 143/61 137/63 (!) 155/68 (!) 154/70  Pulse: 67 73 68 71  Resp:  18 18 17   Temp: 98.9 F (37.2 C) 98.6 F (37 C) 98.6 F (37 C) 98.3 F (36.8 C)  TempSrc: Oral Oral Oral Oral  SpO2: 99% 100% 95% 97%  Weight:      Height:        PHYSICAL EXAM:  Left lower extremity: Patient's splint and dressing remained clean dry and intact. She can flex and extend her toes. Toes are well-perfused and there is intact sensation light touch in all 5 toes in the first dorsal webspace.   LABS  Results for orders placed or performed during the hospital encounter of 02/25/16 (from the past 24 hour(s))  CBC     Status: Abnormal   Collection Time: 02/28/16  4:04 AM  Result Value Ref Range   WBC 7.7 3.6 - 11.0 K/uL   RBC 3.39 (L) 3.80 - 5.20 MIL/uL   Hemoglobin 10.9 (L) 12.0 - 16.0 g/dL   HCT 31.3 (L) 35.0 - 47.0 %   MCV 92.4 80.0 - 100.0 fL   MCH 32.2 26.0 - 34.0 pg   MCHC 34.9 32.0 - 36.0 g/dL   RDW 13.4 11.5 - 14.5 %   Platelets 219 150 - 440 K/uL  Basic metabolic panel     Status: Abnormal   Collection Time: 02/28/16  4:04 AM  Result Value Ref Range   Sodium 143 135 - 145 mmol/L   Potassium 3.9 3.5 - 5.1 mmol/L   Chloride 112 (H) 101 - 111 mmol/L   CO2 27 22 - 32 mmol/L   Glucose, Bld 104 (H) 65 - 99 mg/dL   BUN 8 6 - 20 mg/dL   Creatinine, Ser 0.41 (L) 0.44 - 1.00 mg/dL   Calcium 8.4 (L) 8.9 - 10.3 mg/dL   GFR calc non Af Amer >60 >60 mL/min   GFR calc Af Amer >60 >60 mL/min   Anion gap 4 (L) 5 - 15    Dg Ankle Complete Left  Result Date: 02/26/2016 CLINICAL DATA:  Status post fixation of left ankle fractures today. Initial encounter. EXAM: LEFT ANKLE COMPLETE - 3+ VIEW COMPARISON:  CT left ankle 02/25/2016. FINDINGS: The  patient is in a new fiberglass cast. Lateral plate and screws and interfragmentary screws are in place for fixation of a distal fibular fracture. Two screws are seen in both the posterior and medial malleolus for fracture fixation. Position and alignment are anatomic. No acute abnormality is identified. IMPRESSION: Status post fixation of trimalleolar fractures without evidence of complication. Electronically Signed   By: Inge Rise M.D.   On: 02/26/2016 17:25   Pelvis Portable  Result Date: 02/26/2016 CLINICAL DATA:  Fall down stairs. Trimalleolar fracture status post ORIF. EXAM: PORTABLE PELVIS 1-2 VIEWS COMPARISON:  None. FINDINGS: There is no evidence of pelvic fracture or diastasis. No focal osseous lesion is seen. Hip joint space widths are preserved. Clips are present in the left pelvis. IMPRESSION: No evidence of acute osseous abnormality. Electronically Signed   By: Logan Bores M.D.   On: 02/26/2016 19:10    Assessment/Plan: 2 Days Post-Op   Active Problems:   Fracture dislocation of ankle joint, left, closed,  initial encounter  Patient is doing well postop. She'll be discharged home today with home health PT. Patient was instructed to continue strict elevation left lower extremity. She is nonweightbearing on the left lower extremity. She will take enteric-coated aspirin 325 mg by mouth twice a day for DVT prophylaxis. She'll follow up with me in the office in approximately 10 days.    Thornton Park , MD 02/28/2016, 12:20 PM

## 2016-02-28 NOTE — Progress Notes (Signed)
OT Cancellation Note  Patient Details Name: Margaret Warner MRN: [id number] DOB: 1950/04/28   Cancelled Treatment:    Reason Eval/Treat Not Completed: Patient declined, no reason specified. Pt politely declined OT treatment, already dressed and preparing to return home shortly.   Corky Sox, OTR/L 02/28/2016, 1:10 PM

## 2016-03-06 DIAGNOSIS — S82899A Other fracture of unspecified lower leg, initial encounter for closed fracture: Secondary | ICD-10-CM | POA: Insufficient documentation

## 2016-03-15 NOTE — Discharge Summary (Signed)
Physician Discharge Summary  Patient ID: Margaret Warner MRN: [id number] DOB/AGE: [birth year] 66 y.o.  Admit date: 02/25/2016 Discharge date: 03/15/2016  Admission Diagnoses:  LEFT ANKLE TRIMALLEOLAR FRACTURE DISLOCATION <principal problem not specified>  Discharge Diagnoses:  LEFT ANKLE TRIMALLEOLAR FRACTURE DISLOCATION Active Problems:   Fracture dislocation of ankle joint, left, closed, initial encounter s/p ORIF LEFT ANKLE TRIMALLEOLAR FRACTURE DISLOCATION  Past Medical History:  Diagnosis Date  . Depression   . Skin cancer     Surgeries: Procedure(s): OPEN REDUCTION INTERNAL FIXATION (ORIF) TRIMALLEOLAR ANKLE FRACTURE DISLOCATION on  02/26/2016   Consultants (if any): Dr. Benjie Karvonen (hospitalist)  Discharged Condition: Improved  Hospital Course: Margaret Warner is an 66 y.o. female who was admitted 02/25/2016 with a diagnosis of  LEFT ANKLETRIMALLEOLAR FRACTURE DISLOCATION <principal problem not specified> and went to the operating room on 02/26/2016 and underwent an uncomplicated ORIF of the left ankle.    She was given perioperative antibiotics:  Anti-infectives    Start     Dose/Rate Route Frequency Ordered Stop   02/26/16 1900  ceFAZolin (ANCEF) IVPB 2g/100 mL premix  Status:  Discontinued     2 g 200 mL/hr over 30 Minutes Intravenous Every 6 hours 02/26/16 1733 02/26/16 1814   02/26/16 1900  ceFAZolin (ANCEF) IVPB 2 g/50 mL premix  Status:  Discontinued     2 g 100 mL/hr over 30 Minutes Intravenous Every 6 hours 02/26/16 1814 02/28/16 1811   02/26/16 1745  valACYclovir (VALTREX) tablet 1,000 mg  Status:  Discontinued     1,000 mg Oral 3 times daily 02/26/16 1733 02/28/16 1811    .  She was given sequential compression devices, early ambulation, and lovenox for DVT prophylaxis.  She benefited maximally from the hospital stay and there were no complications.  She progressed well with physical therapy.  She was discharged home on POD #2.  Recent vital signs:   Vitals:   02/28/16 0415 02/28/16 0817  BP: (!) 155/68 (!) 154/70  Pulse: 68 71  Resp: 18 17  Temp: 98.6 F (37 C) 98.3 F (36.8 C)    Recent laboratory studies:  Lab Results  Component Value Date   HGB 10.9 (L) 02/28/2016   HGB 11.0 (L) 02/27/2016   HGB 12.6 02/26/2016   Lab Results  Component Value Date   WBC 7.7 02/28/2016   PLT 219 02/28/2016   Lab Results  Component Value Date   INR 0.96 02/25/2016   Lab Results  Component Value Date   NA 143 02/28/2016   K 3.9 02/28/2016   CL 112 (H) 02/28/2016   CO2 27 02/28/2016   BUN 8 02/28/2016   CREATININE 0.41 (L) 02/28/2016   GLUCOSE 104 (H) 02/28/2016    Discharge Medications:   Allergies as of 02/28/2016      Reactions   Tetracyclines & Related Rash      Medication List    STOP taking these medications   HYDROcodone-acetaminophen 5-325 MG tablet Commonly known as:  NORCO/VICODIN     TAKE these medications   ALPRAZolam 0.5 MG tablet Commonly known as:  XANAX Take 0.5 mg by mouth at bedtime as needed for anxiety.   buPROPion 300 MG 24 hr tablet Commonly known as:  WELLBUTRIN XL Take 1 tablet by mouth daily.   butalbital-acetaminophen-caffeine 50-325-40 MG tablet Commonly known as:  FIORICET, ESGIC Take 1 tablet by mouth as needed.   COMBIPATCH 0.05-0.14 MG/DAY Generic drug:  estradiol-norethindrone Place 1 patch onto the skin 2 (two) times a week.  DULoxetine 30 MG capsule Commonly known as:  CYMBALTA Take 1 capsule by mouth daily.   fluticasone 50 MCG/ACT nasal spray Commonly known as:  FLONASE Place 2 sprays into both nostrils daily.   gabapentin 300 MG capsule Commonly known as:  NEURONTIN Take 1 capsule by mouth 2 (two) times daily.   oxyCODONE 5 MG immediate release tablet Commonly known as:  Oxy IR/ROXICODONE Take 1-2 tablets (5-10 mg total) by mouth every 4 (four) hours as needed for breakthrough pain (for pain).   rosuvastatin 10 MG tablet Commonly known as:  CRESTOR Take 1  tablet by mouth daily.   tiZANidine 4 MG tablet Commonly known as:  ZANAFLEX Take 1 tablet by mouth 3 (three) times daily.   valACYclovir 1000 MG tablet Commonly known as:  VALTREX Take 1 tablet by mouth 3 (three) times daily.       Diagnostic Studies: Dg Ankle Complete Left  Result Date: 02/26/2016 CLINICAL DATA:  Status post fixation of left ankle fractures today. Initial encounter. EXAM: LEFT ANKLE COMPLETE - 3+ VIEW COMPARISON:  CT left ankle 02/25/2016. FINDINGS: The patient is in a new fiberglass cast. Lateral plate and screws and interfragmentary screws are in place for fixation of a distal fibular fracture. Two screws are seen in both the posterior and medial malleolus for fracture fixation. Position and alignment are anatomic. No acute abnormality is identified. IMPRESSION: Status post fixation of trimalleolar fractures without evidence of complication. Electronically Signed   By: Inge Rise M.D.   On: 02/26/2016 17:25   Dg Ankle Complete Left  Result Date: 02/25/2016 CLINICAL DATA:  Trimalleolar ankle fracture. EXAM: LEFT ANKLE COMPLETE - 3+ VIEW COMPARISON:  Earlier the same day. FINDINGS: Posterior dislocation of the talus is been reduced in the interval. There is persistent lateral subluxation of the talar dome relative to the distal tibia. Comminuted distal fibula fracture noted with medial malleolus fracture the distal tibia and posterior lip distal tibia fracture associated. IMPRESSION: Interval reduction of tibiotalar dislocation although lateral subluxation of the talus relative to the tibia persists. Electronically Signed   By: Misty Stanley M.D.   On: 02/25/2016 21:50   Dg Ankle Complete Left  Result Date: 02/25/2016 CLINICAL DATA:  Status post attempted reduction of left trimalleolar ankle fracture/dislocation. Initial encounter. EXAM: LEFT ANKLE COMPLETE - 3+ VIEW COMPARISON:  None. FINDINGS: There is improved alignment of the comminuted trimalleolar fracture of the  left ankle. However, there is persistent posterior dislocation of the talus, lodged against the posterior malleolar fracture site. Significantly posteriorly displaced distal fibular and posterior malleolar fragments are again noted, with a mildly displaced medial malleolar fracture. No new fractures are seen. Surrounding soft tissue swelling is noted. The subtalar joint is grossly unremarkable. IMPRESSION: Persistent posterior dislocation of the talus, lodged against the posterior malleolar fracture site. Significantly posteriorly displaced distal fibular and posterior malleolar fragments again noted, with a mildly displaced medial malleolar fracture. Alignment is somewhat improved from the prior study. These results were called by telephone at the time of interpretation on 02/25/2016 at 8:05 pm to Nursing at the [facility] ER, who verbally acknowledged these results. Electronically Signed   By: Garald Balding M.D.   On: 02/25/2016 20:05   Dg Ankle Complete Left  Result Date: 02/25/2016 CLINICAL DATA:  Patient fell down stairs. Left ankle pain and deformity. EXAM: LEFT ANKLE COMPLETE - 3+ VIEW COMPARISON:  None. FINDINGS: Comminuted distal fibular fracture is associated with posterior lip fracture of the distal tibia and medial malleolar  fracture. There is medial dislocation of the distal tibia relative to the talar dome. IMPRESSION: Comminuted trimalleolar fracture dislocation. Electronically Signed   By: Misty Stanley M.D.   On: 02/25/2016 17:28   Pelvis Portable  Result Date: 02/26/2016 CLINICAL DATA:  Fall down stairs. Trimalleolar fracture status post ORIF. EXAM: PORTABLE PELVIS 1-2 VIEWS COMPARISON:  None. FINDINGS: There is no evidence of pelvic fracture or diastasis. No focal osseous lesion is seen. Hip joint space widths are preserved. Clips are present in the left pelvis. IMPRESSION: No evidence of acute osseous abnormality. Electronically Signed   By: Logan Bores M.D.   On: 02/26/2016 19:10    Ct Ankle Left Wo Contrast  Result Date: 02/25/2016 CLINICAL DATA:  66 year old female with left ankle fracture dislocation. EXAM: CT OF THE LEFT ANKLE WITHOUT CONTRAST TECHNIQUE: Multidetector CT imaging of the left ankle was performed according to the standard protocol. Multiplanar CT image reconstructions were also generated. COMPARISON:  Left ankle radiographs dated 02/25/2016 FINDINGS: Bones/Joint/Cartilage There is a displaced and comminuted fracture of the distal fibula. There is extension of the fracture fragment into the lateral tibiotalar articulation. There is displaced corner fracture of the lateral aspect of the distal tibia at the talotibial articulation. There is fracture of the medial malleolus with approximately 5 mm lateral displacement of the distal fracture fragment. There is mildly displaced comminuted fracture of the posterior malleolus. No other acute fracture identified. The bones are osteopenic. There is approximately 5 mm lateral subluxation of the talus in relation to the tibia. Ligaments Suboptimally assessed by CT. Muscles and Tendons No significant intramuscular hematoma. No definite tendon injury by CT. The Achilles tendon is intact. Soft tissues There is diffuse subcutaneous soft tissue edema as well as edema and contusion of the deeper fascia layer. A 15 mm rounded low attenuating intermuscular focus in the posterior compartment of the distal leg demonstrates fat attenuation. IMPRESSION: 1. Comminuted and displaced fracture of the distal fibula as well as fractures of the posterior and medial malleoli. There is also mildly displaced corner fracture of the lateral distal tibia at the talotibial articulation. 2. Approximately 5 mm lateral subluxation of the talus in relation to the tibia. Electronically Signed   By: Anner Crete M.D.   On: 02/25/2016 22:57   Ct 3d Recon At Scanner  Result Date: 02/25/2016 CLINICAL DATA:  66 year old female with left ankle fracture  dislocation. EXAM: CT OF THE LEFT ANKLE WITHOUT CONTRAST TECHNIQUE: Multidetector CT imaging of the left ankle was performed according to the standard protocol. Multiplanar CT image reconstructions were also generated. COMPARISON:  Left ankle radiographs dated 02/25/2016 FINDINGS: Bones/Joint/Cartilage There is a displaced and comminuted fracture of the distal fibula. There is extension of the fracture fragment into the lateral tibiotalar articulation. There is displaced corner fracture of the lateral aspect of the distal tibia at the talotibial articulation. There is fracture of the medial malleolus with approximately 5 mm lateral displacement of the distal fracture fragment. There is mildly displaced comminuted fracture of the posterior malleolus. No other acute fracture identified. The bones are osteopenic. There is approximately 5 mm lateral subluxation of the talus in relation to the tibia. Ligaments Suboptimally assessed by CT. Muscles and Tendons No significant intramuscular hematoma. No definite tendon injury by CT. The Achilles tendon is intact. Soft tissues There is diffuse subcutaneous soft tissue edema as well as edema and contusion of the deeper fascia layer. A 15 mm rounded low attenuating intermuscular focus in the posterior compartment of the distal  leg demonstrates fat attenuation. IMPRESSION: 1. Comminuted and displaced fracture of the distal fibula as well as fractures of the posterior and medial malleoli. There is also mildly displaced corner fracture of the lateral distal tibia at the talotibial articulation. 2. Approximately 5 mm lateral subluxation of the talus in relation to the tibia. Electronically Signed   By: Anner Crete M.D.   On: 02/25/2016 22:57   Chest Portable 1 View  Result Date: 02/26/2016 CLINICAL DATA:  Preoperative evaluation prior to ankle surgery. EXAM: PORTABLE CHEST 1 VIEW COMPARISON:  None. FINDINGS: The cardiac and mediastinal silhouettes are within normal limits.  Mild aortic atherosclerosis. The lungs are normally inflated. No airspace consolidation, pleural effusion, or pulmonary edema is identified. There is no pneumothorax. No acute osseous abnormality identified. IMPRESSION: 1. No active cardiopulmonary disease. 2. Aortic atherosclerosis. Electronically Signed   By: Jeannine Boga M.D.   On: 02/26/2016 02:14    Disposition: 01-Home or Self Care  Discharge Instructions    Call MD / Call 911    Complete by:  As directed    If you experience chest pain or shortness of breath, CALL 911 and be transported to the hospital emergency room.  If you develope a fever above 101 F, pus (white drainage) or increased drainage or redness at the wound, or calf pain, call your surgeon's office.   Constipation Prevention    Complete by:  As directed    Drink plenty of fluids.  Prune juice may be helpful.  You may use a stool softener, such as Colace (over the counter) 100 mg twice a day.  Use MiraLax (over the counter) for constipation as needed.   Diet - low sodium heart healthy    Complete by:  As directed    Discharge instructions    Complete by:  As directed    Continue strict elevation left lower extremity at home. She may continue to apply ice to the left ankle as needed. Patient must remain nonweightbearing on the left lower extremity. She'll use a walker, crutches or a scooter to assist her with ambulation. Patient will receive home health physical therapy. She will follow up with me in the office in approximately 10 days. She will take enteric-coated aspirin 325 mg by mouth twice a day for DVT prophylaxis 6 weeks.   Driving restrictions    Complete by:  As directed    No driving until follow-up in the orthopedic office.   Increase activity slowly as tolerated    Complete by:  As directed    Lifting restrictions    Complete by:  As directed    No lifting for 12 weeks      Follow-up Information    Thornton Park, MD. Go on 03/07/2016.    Specialty:  Orthopedic Surgery Why:  Thursday March 1st at 2:00pm Contact information: [place] [address] [phone]            Signed: Thornton Park ,MD 03/15/2016, 6:19 PM

## 2016-07-31 ENCOUNTER — Ambulatory Visit (INDEPENDENT_AMBULATORY_CARE_PROVIDER_SITE_OTHER): Payer: Medicare Other | Admitting: Podiatry

## 2016-07-31 VITALS — BP 132/81 | HR 64 | Temp 98.3°F | Resp 16

## 2016-07-31 DIAGNOSIS — B351 Tinea unguium: Secondary | ICD-10-CM

## 2016-07-31 DIAGNOSIS — L603 Nail dystrophy: Secondary | ICD-10-CM | POA: Diagnosis not present

## 2016-07-31 NOTE — Progress Notes (Signed)
Subjective:     Patient ID: Margaret Warner, female   DOB: Nov 02, 1950, 66 y.o.   MRN: [id number]  HPI: Margaret Warner presents today with a chief concern of a toenail hallux left that has not grown in the past 3 months. She denies any trauma denies any pain 60s there is some slight tenderness when she pushes on the tip of the nail. She also concerned about some discoloration on the superficial surface of the third nail plate right foot.   Review of Systems  Musculoskeletal: Positive for arthralgias.  Neurological: Positive for tremors and headaches.  All other systems reviewed and are negative.      Objective:   Physical Exam: Vital signs are stable alert and oriented 3. Pulses are palpable. Neurologic sensorium is intact. Degenerative reflexes are intact. Muscle strength is intact to lateral. Orthopedic evaluation shows all joints distal to the ankle for range of motion without crepitation. Each hallux does demonstrate some extension at the level of the interphalangeal joint. Cutaneous evaluation demonstrates superficial white onychomycosis third nail plate right foot. The hallux right does not demonstrate any type of significant dystrophy as of yet. No erythema cellulitis drainage or odor to the plate nontender on palpation.     Assessment:     Nail dystrophy and onychomycosis.    Plan:     Started her on formula 3 and expressed to her that there is really nothing we can do about the hallux nail plate right. Follow-up with me as needed

## 2016-08-22 ENCOUNTER — Other Ambulatory Visit: Payer: Self-pay | Admitting: Orthopedic Surgery

## 2016-08-22 NOTE — Pre-Procedure Instructions (Signed)
EKG  Normal sinus rhythm without ST segment elevation. ____________________________________________  Excello, personally viewed and evaluated these images (plain radiographs) as part of my medical decision making, as well as reviewing the written report by the radiologist.  Dg Ankle Complete Left  Result Date: 02/25/2016 CLINICAL DATA:  Trimalleolar ankle fracture. EXAM: LEFT ANKLE COMPLETE - 3+ VIEW COMPARISON:  Earlier the same day. FINDINGS: Posterior dislocation of the talus is been reduced in the interval. There is persistent lateral subluxation of the talar dome relative to the distal tibia. Comminuted distal fibula fracture noted with medial malleolus fracture the distal tibia and posterior lip distal tibia fracture associated. IMPRESSION: Interval reduction of tibiotalar dislocation although lateral subluxation of the talus relative to the tibia persists. Electronically Signed   By: Misty Stanley M.D.   On: 02/25/2016 21:50   Dg Ankle Complete Left  Result Date: 02/25/2016 CLINICAL DATA:  Status post attempted reduction of left trimalleolar ankle fracture/dislocation. Initial encounter. EXAM: LEFT ANKLE COMPLETE - 3+ VIEW COMPARISON:  None. FINDINGS: There is improved alignment of the comminuted trimalleolar fracture of the left ankle. However, there is persistent posterior dislocation of the talus, lodged against the posterior malleolar fracture site. Significantly posteriorly displaced distal fibular and posterior malleolar fragments are again noted, with a mildly displaced medial malleolar fracture. No new fractures are seen. Surrounding soft tissue swelling is noted. The subtalar joint is grossly unremarkable. IMPRESSION: Persistent posterior dislocation of the talus, lodged against the posterior malleolar fracture site. Significantly posteriorly displaced distal fibular and posterior malleolar fragments again noted, with a mildly displaced medial malleolar  fracture. Alignment is somewhat improved from the prior study. These results were called by telephone at the time of interpretation on 02/25/2016 at 8:05 pm to Nursing at the [facility], who verbally acknowledged these results. Electronically Signed   By: Garald Balding M.D.   On: 02/25/2016 20:05   Dg Ankle Complete Left  Result Date: 02/25/2016 CLINICAL DATA:  Patient fell down stairs. Left ankle pain and deformity. EXAM: LEFT ANKLE COMPLETE - 3+ VIEW COMPARISON:  None. FINDINGS: Comminuted distal fibular fracture is associated with posterior lip fracture of the distal tibia and medial malleolar fracture. There is medial dislocation of the distal tibia relative to the talar dome. IMPRESSION: Comminuted trimalleolar fracture dislocation. Electronically Signed   By: Misty Stanley M.D.   On: 02/25/2016 17:28   Ct Ankle Left Wo Contrast  Result Date: 02/25/2016 CLINICAL DATA:  66 year old female with left ankle fracture dislocation. EXAM: CT OF THE LEFT ANKLE WITHOUT CONTRAST TECHNIQUE: Multidetector CT imaging of the left ankle was performed according to the standard protocol. Multiplanar CT image reconstructions were also generated. COMPARISON:  Left ankle radiographs dated 02/25/2016 FINDINGS: Bones/Joint/Cartilage There is a displaced and comminuted fracture of the distal fibula. There is extension of the fracture fragment into the lateral tibiotalar articulation. There is displaced corner fracture of the lateral aspect of the distal tibia at the talotibial articulation. There is fracture of the medial malleolus with approximately 5 mm lateral displacement of the distal fracture fragment. There is mildly displaced comminuted fracture of the posterior malleolus. No other acute fracture identified. The bones are osteopenic. There is approximately 5 mm lateral subluxation of the talus in relation to the tibia. Ligaments Suboptimally assessed by CT. Muscles and Tendons No significant intramuscular  hematoma. No definite tendon injury by CT. The Achilles tendon is intact. Soft tissues There is diffuse subcutaneous soft tissue edema as well as edema  and contusion of the deeper fascia layer. A 15 mm rounded low attenuating intermuscular focus in the posterior compartment of the distal leg demonstrates fat attenuation. IMPRESSION: 1. Comminuted and displaced fracture of the distal fibula as well as fractures of the posterior and medial malleoli. There is also mildly displaced corner fracture of the lateral distal tibia at the talotibial articulation. 2. Approximately 5 mm lateral subluxation of the talus in relation to the tibia. Electronically Signed   By: Anner Crete M.D.   On: 02/25/2016 22:57   Ct 3d Recon At Scanner  Result Date: 02/25/2016 CLINICAL DATA:  66 year old female with left ankle fracture dislocation. EXAM: CT OF THE LEFT ANKLE WITHOUT CONTRAST TECHNIQUE: Multidetector CT imaging of the left ankle was performed according to the standard protocol. Multiplanar CT image reconstructions were also generated. COMPARISON:  Left ankle radiographs dated 02/25/2016 FINDINGS: Bones/Joint/Cartilage There is a displaced and comminuted fracture of the distal fibula. There is extension of the fracture fragment into the lateral tibiotalar articulation. There is displaced corner fracture of the lateral aspect of the distal tibia at the talotibial articulation. There is fracture of the medial malleolus with approximately 5 mm lateral displacement of the distal fracture fragment. There is mildly displaced comminuted fracture of the posterior malleolus. No other acute fracture identified. The bones are osteopenic. There is approximately 5 mm lateral subluxation of the talus in relation to the tibia. Ligaments Suboptimally assessed by CT. Muscles and Tendons No significant intramuscular hematoma. No definite tendon injury by CT. The Achilles tendon is intact. Soft tissues There is diffuse subcutaneous soft  tissue edema as well as edema and contusion of the deeper fascia layer. A 15 mm rounded low attenuating intermuscular focus in the posterior compartment of the distal leg demonstrates fat attenuation. IMPRESSION: 1. Comminuted and displaced fracture of the distal fibula as well as fractures of the posterior and medial malleoli. There is also mildly displaced corner fracture of the lateral distal tibia at the talotibial articulation. 2. Approximately 5 mm lateral subluxation of the talus in relation to the tibia. Electronically Signed   By: Anner Crete M.D.   On: 02/25/2016 22:57    ____________________________________________    PROCEDURES  Procedure(s) performed:    Procedures    Medications  0.9 %  sodium chloride infusion ( Intravenous New Bag/Given 02/26/16 0016)  morphine 2 MG/ML injection 2 mg (not administered)  methocarbamol (ROBAXIN) tablet 500 mg (not administered)    Or  methocarbamol (ROBAXIN) 500 mg in dextrose 5 % 50 mL IVPB (not administered)  oxyCODONE (Oxy IR/ROXICODONE) immediate release tablet 5-10 mg (not administered)  ondansetron (ZOFRAN) injection 4 mg (4 mg Intravenous Given 02/25/16 1729)  morphine 2 MG/ML injection 2 mg (2 mg Intravenous Given 02/25/16 1729)  lidocaine (XYLOCAINE) 1 % (with pres) injection 10 mL (10 mLs Infiltration Given by Other 02/25/16 1952)  bupivacaine (MARCAINE) 0.25 % (with pres) injection 30 mL (30 mLs Infiltration Given by Other 02/25/16 1951)  morphine 2 MG/ML injection 2 mg (2 mg Intravenous Given 02/25/16 1813)  morphine 2 MG/ML injection 2 mg (2 mg Intravenous Given 02/25/16 2314)     ____________________________________________   INITIAL IMPRESSION / ASSESSMENT AND PLAN / ED COURSE  Pertinent labs & imaging results that were available during my care of the patient were reviewed by me and considered in my medical decision making (see chart for details).  Review of the [place] CSRS was performed in accordance of  the [place] prior to dispensing any controlled drugs.  Assessment and Plan:  Closed fracture of left ankle, initial encounter.  Patient presents to the emergency department with left ankle pain after falling down 2 stairs at her home. DG left ankle reveals a comminuted distal fibular fracture, with posterior lip fracture of the distal tibia and medial malleolar fracture. In addition, patient had trimalleolar fracture dislocation. Dr. Mack Guise, the orthopedist on-call, was consulted. Dr. Mack Guise performed patient's left ankle reduction in the emergency department. Patient's care was assumed by Dr. Mack Guise for transfer to ortho service, likely admission and surgical planning.     ____________________________________________  FINAL CLINICAL IMPRESSION(S) / ED DIAGNOSES  Final diagnoses:  Closed fracture of left ankle, initial encounter      NEW MEDICATIONS STARTED DURING THIS VISIT:  Current Discharge Medication List          This chart was dictated using voice recognition software/Dragon. Despite best efforts to proofread, errors can occur which can change the meaning. Any change was purely unintentional.    Lannie Fields, PA-C 02/26/16 0052    Nance Pear, MD 02/27/16 1154     Electronically signed by Lannie Fields, PA-C at 02/26/2016 12:52 AM Electronically signed by Nance Pear, MD at 02/27/2016 11:54 AM      ED to Hosp-Admission (Discharged) on 02/25/2016        Revision History        Detailed Report

## 2016-08-23 ENCOUNTER — Encounter
Admission: RE | Admit: 2016-08-23 | Discharge: 2016-08-23 | Disposition: A | Discharge status: Home / Self Care | Payer: Medicare Other | Source: Ambulatory Visit | Attending: Orthopedic Surgery | Admitting: Orthopedic Surgery

## 2016-08-23 HISTORY — DX: Headache, unspecified: R51.9

## 2016-08-23 HISTORY — DX: Adverse effect of unspecified anesthetic, initial encounter: T41.45XA

## 2016-08-23 HISTORY — DX: Other complications of anesthesia, initial encounter: T88.59XA

## 2016-08-23 HISTORY — DX: Unspecified osteoarthritis, unspecified site: M19.90

## 2016-08-23 HISTORY — DX: Gastro-esophageal reflux disease without esophagitis: K21.9

## 2016-08-23 HISTORY — DX: Headache: R51

## 2016-08-23 HISTORY — DX: Essential (primary) hypertension: I10

## 2016-08-23 HISTORY — DX: Anemia, unspecified: D64.9

## 2016-08-23 HISTORY — DX: Anxiety disorder, unspecified: F41.9

## 2016-08-23 NOTE — Patient Instructions (Signed)
  Your procedure is scheduled on: 08-27-16 TUESDAY Report to Same Day Surgery 2nd floor medical mall [facility] Entrance-take elevator on left to 2nd floor.  Check in with surgery information desk.) To find out your arrival time please call [phone] between 1PM - 3PM on 08-26-16 MONDAY  Remember: Instructions that are not followed completely may result in serious medical risk, up to and including death, or upon the discretion of your surgeon and anesthesiologist your surgery may need to be rescheduled.    _x___ 1. Do not eat food or drink liquids after midnight. No gum chewing or hard candies.     __x__ 2. No Alcohol for 24 hours before or after surgery.   __x__3. No Smoking for 24 prior to surgery.   ____  4. Bring all medications with you on the day of surgery if instructed.    __x__ 5. Notify your doctor if there is any change in your medical condition     (cold, fever, infections).     Do not wear jewelry, make-up, hairpins, clips or nail polish.  Do not wear lotions, powders, or perfumes. You may wear deodorant.  Do not shave 48 hours prior to surgery. Men may shave face and neck.  Do not bring valuables to the hospital.    [facility] is not responsible for any belongings or valuables.               Contacts, dentures or bridgework may not be worn into surgery.  Leave your suitcase in the car. After surgery it may be brought to your room.  For patients admitted to the hospital, discharge time is determined by your treatment team.   Patients discharged the day of surgery will not be allowed to drive home.  You will need someone to drive you home and stay with you the night of your procedure.    Please read over the following fact sheets that you were given:    _x___ [place] WITH A SMALL SIP OF WATER. These include:  1. WELLBUTRIN (BUPROPION)  2. CYMBALTA (DULOXETINE)  3. GABAPENTIN (NEURONTIN)  4. MAY TAKE XANAX IF  NEEDED  5.  6.  ____Fleets enema or Magnesium Citrate as directed.   _x___ Use CHG Soap or sage wipes as directed on instruction sheet   ____ Use inhalers on the day of surgery and bring to hospital day of surgery  ____ Stop Metformin and Janumet 2 days prior to surgery.    ____ Take 1/2 of usual insulin dose the night before surgery and none on the morning surgery.   ____ Follow recommendations from Cardiologist, Pulmonologist or PCP regarding  stopping Aspirin, Coumadin, Pllavix ,Eliquis, Effient, or Pradaxa, and Pletal.  X____Stop Anti-inflammatories such as Advil, Aleve, IBUPROFEN, Motrin, Naproxen, Naprosyn, Goodies powders or aspirin products. OK to take Tylenol OR HYDROCODONE IF NEEDED   ____ Stop supplements until after surgery.     ____ Bring C-Pap to the hospital.

## 2016-08-23 NOTE — Pre-Procedure Instructions (Signed)
Consult Note Date of Service: 02/26/2016 7:36 AM Bettey Costa, MD  Internal Medicine  Expand All Collapse All   [] Hide copied text [] Hover for attribution information Medical Consultation  Margaret Warner FUX:[id number] DOB: [birth year] DOA: 02/25/2016 PCP: Idelle Crouch, MD   Requesting physician: Dr Octavio Manns Date of consultation: 02/26/2016 Reason for consultation: medical clearaince  Impression/Recommendations  66 y/o female with history of depression and hyperlipidemia who presents after a mechanical fall and now has suffered a left ankle fracture dislocation.  1. Preoperative clearance for left ankle fracture: Patient is low risk for moderate risk procedure. Patient may proceed without further cardiac evaluation. DVT prophylaxis as per orthopedic surgery 2. Hyperlipidemia: Continue statin  3. Depression: Continue outpatient medications    Chief Complaint:  Left ankle pain  HPI:  66 year old female with history of depression and hyperlipidemia who presents after a fall and sustained a left ankle fracture dislocation. Pain is currently 6/10. She denies chest pain, dizziness, shortness of breath. She has been in her usual state of health. She is fairly active.  Review of Systems  Constitutional: Negative for fever, chills weight loss HENT: Negative for ear pain, nosebleeds, congestion, facial swelling, rhinorrhea, neck pain, neck stiffness and ear discharge.   Respiratory: Negative for cough, shortness of breath, wheezing  Cardiovascular: Negative for chest pain, palpitations and leg swelling.  Gastrointestinal: Negative for heartburn, abdominal pain, vomiting, diarrhea or consitpation Genitourinary: Negative for dysuria, urgency, frequency, hematuria Musculoskeletal: Negative for back pain or joint pain Neurological: Negative for dizziness, seizures, syncope, focal weakness,  numbness and headaches.  Hematological: Does not bruise/bleed easily.    Psychiatric/Behavioral: Negative for hallucinations, confusion, positive for depression      Past Medical History:  Diagnosis Date  . Depression   . Skin cancer         Past Surgical History:  Procedure Laterality Date  . BACK SURGERY    . BREAST BIOPSY Left 2011   EXCISIONAL - NEG   Social History:  reports that she quit smoking about 12 years ago. Her smoking use included Cigarettes. She has a 30.00 pack-year smoking history. She has never used smokeless tobacco. She reports that she drinks alcohol. She reports that she does not use drugs.      Allergies  Allergen Reactions  . Tetracyclines & Related Rash        Family History  Problem Relation Age of Onset  . Breast cancer Neg Hx            Prior to Admission medications   Medication Sig Start Date End Date Taking? Authorizing Provider  ALPRAZolam Duanne Moron) 0.5 MG tablet Take 0.5 mg by mouth at bedtime as needed for anxiety.   Yes Historical Provider, MD  buPROPion (WELLBUTRIN XL) 300 MG 24 hr tablet Take 1 tablet by mouth daily. 01/28/16  Yes Historical Provider, MD  butalbital-acetaminophen-caffeine (FIORICET, ESGIC) 50-325-40 MG tablet Take 1 tablet by mouth as needed. 02/05/16  Yes Historical Provider, MD  COMBIPATCH 0.05-0.14 MG/DAY Place 1 patch onto the skin 2 (two) times a week.  02/04/16  Yes Historical Provider, MD  DULoxetine (CYMBALTA) 30 MG capsule Take 1 capsule by mouth daily. 12/14/15  Yes Historical Provider, MD  fluticasone (FLONASE) 50 MCG/ACT nasal spray Place 2 sprays into both nostrils daily. 02/22/16  Yes Historical Provider, MD  gabapentin (NEURONTIN) 300 MG capsule Take 1 capsule by mouth 2 (two) times daily. 02/22/16  Yes Historical Provider, MD  HYDROcodone-acetaminophen (NORCO/VICODIN) 5-325 MG tablet Take 1 tablet by mouth  every 4 (four) hours as needed. 02/23/16  Yes Historical Provider, MD  rosuvastatin (CRESTOR) 10 MG tablet Take 1 tablet by mouth daily. 12/25/15  Yes  Historical Provider, MD  tiZANidine (ZANAFLEX) 4 MG tablet Take 1 tablet by mouth 3 (three) times daily. 02/22/16  Yes Historical Provider, MD  valACYclovir (VALTREX) 1000 MG tablet Take 1 tablet by mouth 3 (three) times daily. 12/25/15  Yes Historical Provider, MD    Physical Exam: Blood pressure 136/67, pulse (!) 59, temperature 98.1 F (36.7 C), temperature source Oral, resp. rate 16, height 5\' 3"  (1.6 m), weight 63.2 kg (139 lb 6.4 oz), SpO2 97 %. @VITALS2 @     Filed Weights   02/25/16 1704 02/25/16 2351  Weight: 61.2 kg (135 lb) 63.2 kg (139 lb 6.4 oz)    Intake/Output Summary (Last 24 hours) at 02/26/16 0736 Last data filed at 02/26/16 0500  Gross per 24 hour  Intake              355 ml  Output              350 ml  Net                5 ml     Constitutional: Appears well-developed and well-nourished. No distress. HENT: Normocephalic. Marland Kitchen Oropharynx is clear and moist.  Eyes: Conjunctivae and EOM are normal. PERRLA, no scleral icterus.  Neck: Normal ROM. Neck supple. No JVD. No tracheal deviation. CVS: RRR, S1/S2 +, no murmurs, no gallops, no carotid bruit.  Pulmonary: Effort and breath sounds normal, no stridor, rhonchi, wheezes, rales.  Abdominal: Soft. BS +,  no distension, tenderness, rebound or guarding.  Musculoskeletal: valgus deformity left ankle Neuro: Alert. CN 2-12 grossly intact. No focal deficits. Skin: Skin is warm and dry. No rash noted. Psychiatric: Normal mood and affect.    Labs  Basic Metabolic Panel:  LastLabs   Recent Labs Lab 02/26/16 0347  NA 140  K 3.6  CL 105  CO2 29  GLUCOSE 98  BUN 13  CREATININE 0.86  CALCIUM 8.5*     Liver Function Tests:  LastLabs   Recent Labs Lab 02/25/16 1930  AST 19  ALT 13*  ALKPHOS 55  BILITOT 0.6  PROT 7.2  ALBUMIN 4.5     LastLabs  No results for input(s): LIPASE, AMYLASE in the last 168 hours.    CBC:  LastLabs   Recent Labs Lab 02/25/16 1930  02/26/16 0347  WBC 9.1 8.6  NEUTROABS 7.5*  --   HGB 14.3 12.6  HCT 43.1 38.0  MCV 93.7 94.4  PLT 279 252     Cardiac Enzymes: LastLabs  No results for input(s): CKTOTAL, CKMB, CKMBINDEX, TROPONINI in the last 168 hours.   BNP: LastLabs  Invalid input(s): POCBNP   CBG: LastLabs  No results for input(s): GLUCAP in the last 168 hours.    Radiological Exams:  ImagingResults(Last48hours)  Dg Ankle Complete Left  Result Date: 02/25/2016 CLINICAL DATA:  Trimalleolar ankle fracture. EXAM: LEFT ANKLE COMPLETE - 3+ VIEW COMPARISON:  Earlier the same day. FINDINGS: Posterior dislocation of the talus is been reduced in the interval. There is persistent lateral subluxation of the talar dome relative to the distal tibia. Comminuted distal fibula fracture noted with medial malleolus fracture the distal tibia and posterior lip distal tibia fracture associated. IMPRESSION: Interval reduction of tibiotalar dislocation although lateral subluxation of the talus relative to the tibia persists. Electronically Signed   By: Verda Cumins.D.  On: 02/25/2016 21:50   Dg Ankle Complete Left  Result Date: 02/25/2016 CLINICAL DATA:  Status post attempted reduction of left trimalleolar ankle fracture/dislocation. Initial encounter. EXAM: LEFT ANKLE COMPLETE - 3+ VIEW COMPARISON:  None. FINDINGS: There is improved alignment of the comminuted trimalleolar fracture of the left ankle. However, there is persistent posterior dislocation of the talus, lodged against the posterior malleolar fracture site. Significantly posteriorly displaced distal fibular and posterior malleolar fragments are again noted, with a mildly displaced medial malleolar fracture. No new fractures are seen. Surrounding soft tissue swelling is noted. The subtalar joint is grossly unremarkable. IMPRESSION: Persistent posterior dislocation of the talus, lodged against the posterior malleolar fracture site. Significantly posteriorly  displaced distal fibular and posterior malleolar fragments again noted, with a mildly displaced medial malleolar fracture. Alignment is somewhat improved from the prior study. These results were called by telephone at the time of interpretation on 02/25/2016 at 8:05 pm to Nursing at the [facility], who verbally acknowledged these results. Electronically Signed   By: Garald Balding M.D.   On: 02/25/2016 20:05   Dg Ankle Complete Left  Result Date: 02/25/2016 CLINICAL DATA:  Patient fell down stairs. Left ankle pain and deformity. EXAM: LEFT ANKLE COMPLETE - 3+ VIEW COMPARISON:  None. FINDINGS: Comminuted distal fibular fracture is associated with posterior lip fracture of the distal tibia and medial malleolar fracture. There is medial dislocation of the distal tibia relative to the talar dome. IMPRESSION: Comminuted trimalleolar fracture dislocation. Electronically Signed   By: Misty Stanley M.D.   On: 02/25/2016 17:28   Ct Ankle Left Wo Contrast  Result Date: 02/25/2016 CLINICAL DATA:  65 year old female with left ankle fracture dislocation. EXAM: CT OF THE LEFT ANKLE WITHOUT CONTRAST TECHNIQUE: Multidetector CT imaging of the left ankle was performed according to the standard protocol. Multiplanar CT image reconstructions were also generated. COMPARISON:  Left ankle radiographs dated 02/25/2016 FINDINGS: Bones/Joint/Cartilage There is a displaced and comminuted fracture of the distal fibula. There is extension of the fracture fragment into the lateral tibiotalar articulation. There is displaced corner fracture of the lateral aspect of the distal tibia at the talotibial articulation. There is fracture of the medial malleolus with approximately 5 mm lateral displacement of the distal fracture fragment. There is mildly displaced comminuted fracture of the posterior malleolus. No other acute fracture identified. The bones are osteopenic. There is approximately 5 mm lateral subluxation of the talus  in relation to the tibia. Ligaments Suboptimally assessed by CT. Muscles and Tendons No significant intramuscular hematoma. No definite tendon injury by CT. The Achilles tendon is intact. Soft tissues There is diffuse subcutaneous soft tissue edema as well as edema and contusion of the deeper fascia layer. A 15 mm rounded low attenuating intermuscular focus in the posterior compartment of the distal leg demonstrates fat attenuation. IMPRESSION: 1. Comminuted and displaced fracture of the distal fibula as well as fractures of the posterior and medial malleoli. There is also mildly displaced corner fracture of the lateral distal tibia at the talotibial articulation. 2. Approximately 5 mm lateral subluxation of the talus in relation to the tibia. Electronically Signed   By: Anner Crete M.D.   On: 02/25/2016 22:57   Ct 3d Recon At Scanner  Result Date: 02/25/2016 CLINICAL DATA:  67 year old female with left ankle fracture dislocation. EXAM: CT OF THE LEFT ANKLE WITHOUT CONTRAST TECHNIQUE: Multidetector CT imaging of the left ankle was performed according to the standard protocol. Multiplanar CT image reconstructions were also generated. COMPARISON:  Left ankle radiographs dated 02/25/2016 FINDINGS: Bones/Joint/Cartilage There is a displaced and comminuted fracture of the distal fibula. There is extension of the fracture fragment into the lateral tibiotalar articulation. There is displaced corner fracture of the lateral aspect of the distal tibia at the talotibial articulation. There is fracture of the medial malleolus with approximately 5 mm lateral displacement of the distal fracture fragment. There is mildly displaced comminuted fracture of the posterior malleolus. No other acute fracture identified. The bones are osteopenic. There is approximately 5 mm lateral subluxation of the talus in relation to the tibia. Ligaments Suboptimally assessed by CT. Muscles and Tendons No significant intramuscular hematoma.  No definite tendon injury by CT. The Achilles tendon is intact. Soft tissues There is diffuse subcutaneous soft tissue edema as well as edema and contusion of the deeper fascia layer. A 15 mm rounded low attenuating intermuscular focus in the posterior compartment of the distal leg demonstrates fat attenuation. IMPRESSION: 1. Comminuted and displaced fracture of the distal fibula as well as fractures of the posterior and medial malleoli. There is also mildly displaced corner fracture of the lateral distal tibia at the talotibial articulation. 2. Approximately 5 mm lateral subluxation of the talus in relation to the tibia. Electronically Signed   By: Anner Crete M.D.   On: 02/25/2016 22:57   Chest Portable 1 View  Result Date: 02/26/2016 CLINICAL DATA:  Preoperative evaluation prior to ankle surgery. EXAM: PORTABLE CHEST 1 VIEW COMPARISON:  None. FINDINGS: The cardiac and mediastinal silhouettes are within normal limits. Mild aortic atherosclerosis. The lungs are normally inflated. No airspace consolidation, pleural effusion, or pulmonary edema is identified. There is no pneumothorax. No acute osseous abnormality identified. IMPRESSION: 1. No active cardiopulmonary disease. 2. Aortic atherosclerosis. Electronically Signed   By: Jeannine Boga M.D.   On: 02/26/2016 02:14     EKG: NSR nonspecificTW changes in anterolateral lead   Thank you for allowing me to participate in the care of your patient. We will continue to follow.   Note: This dictation was prepared with Dragon dictation along with smaller phrase technology. Any transcriptional errors that result from this process are unintentional.  Time spent: 45 minutes  MODY, Ulice Bold, MD          Electronically signed by Bettey Costa, MD at 02/26/2016 7:46 AM      ED to Hosp-Admission (Discharged) on 02/25/2016        Routing History        Detailed Report

## 2016-08-26 ENCOUNTER — Encounter
Admission: RE | Admit: 2016-08-26 | Discharge: 2016-08-26 | Disposition: A | Discharge status: Home / Self Care | Payer: Medicare Other | Source: Ambulatory Visit | Attending: Orthopedic Surgery | Admitting: Orthopedic Surgery

## 2016-08-26 DIAGNOSIS — Z79891 Long term (current) use of opiate analgesic: Secondary | ICD-10-CM | POA: Diagnosis not present

## 2016-08-26 DIAGNOSIS — I10 Essential (primary) hypertension: Secondary | ICD-10-CM | POA: Diagnosis not present

## 2016-08-26 DIAGNOSIS — Z881 Allergy status to other antibiotic agents status: Secondary | ICD-10-CM | POA: Diagnosis not present

## 2016-08-26 DIAGNOSIS — Z85828 Personal history of other malignant neoplasm of skin: Secondary | ICD-10-CM | POA: Diagnosis not present

## 2016-08-26 DIAGNOSIS — T8484XA Pain due to internal orthopedic prosthetic devices, implants and grafts, initial encounter: Secondary | ICD-10-CM | POA: Diagnosis not present

## 2016-08-26 DIAGNOSIS — F329 Major depressive disorder, single episode, unspecified: Secondary | ICD-10-CM | POA: Diagnosis not present

## 2016-08-26 DIAGNOSIS — K219 Gastro-esophageal reflux disease without esophagitis: Secondary | ICD-10-CM | POA: Diagnosis not present

## 2016-08-26 DIAGNOSIS — F419 Anxiety disorder, unspecified: Secondary | ICD-10-CM | POA: Diagnosis not present

## 2016-08-26 DIAGNOSIS — Z79899 Other long term (current) drug therapy: Secondary | ICD-10-CM | POA: Diagnosis not present

## 2016-08-26 DIAGNOSIS — Z791 Long term (current) use of non-steroidal anti-inflammatories (NSAID): Secondary | ICD-10-CM | POA: Diagnosis not present

## 2016-08-26 DIAGNOSIS — Y831 Surgical operation with implant of artificial internal device as the cause of abnormal reaction of the patient, or of later complication, without mention of misadventure at the time of the procedure: Secondary | ICD-10-CM | POA: Diagnosis not present

## 2016-08-26 DIAGNOSIS — Z87891 Personal history of nicotine dependence: Secondary | ICD-10-CM | POA: Diagnosis not present

## 2016-08-26 DIAGNOSIS — M1990 Unspecified osteoarthritis, unspecified site: Secondary | ICD-10-CM | POA: Diagnosis not present

## 2016-08-26 LAB — APTT: APTT: 30 s (ref 24–36)

## 2016-08-26 LAB — PROTIME-INR
INR: 0.97
Prothrombin Time: 12.9 seconds (ref 11.4–15.2)

## 2016-08-26 LAB — CBC WITH DIFFERENTIAL/PLATELET
Basophils Absolute: 0 10*3/uL (ref 0–0.1)
Basophils Relative: 1 %
EOS PCT: 1 %
Eosinophils Absolute: 0 10*3/uL (ref 0–0.7)
HEMATOCRIT: 39.6 % (ref 35.0–47.0)
Hemoglobin: 13.3 g/dL (ref 12.0–16.0)
LYMPHS PCT: 27 %
Lymphs Abs: 1.9 10*3/uL (ref 1.0–3.6)
MCH: 31.2 pg (ref 26.0–34.0)
MCHC: 33.6 g/dL (ref 32.0–36.0)
MCV: 92.7 fL (ref 80.0–100.0)
MONO ABS: 0.4 10*3/uL (ref 0.2–0.9)
MONOS PCT: 7 %
NEUTROS ABS: 4.5 10*3/uL (ref 1.4–6.5)
Neutrophils Relative %: 64 %
PLATELETS: 292 10*3/uL (ref 150–440)
RBC: 4.27 MIL/uL (ref 3.80–5.20)
RDW: 13.7 % (ref 11.5–14.5)
WBC: 6.9 10*3/uL (ref 3.6–11.0)

## 2016-08-26 LAB — BASIC METABOLIC PANEL
Anion gap: 9 (ref 5–15)
BUN: 19 mg/dL (ref 6–20)
CALCIUM: 9.5 mg/dL (ref 8.9–10.3)
CO2: 29 mmol/L (ref 22–32)
Chloride: 104 mmol/L (ref 101–111)
Creatinine, Ser: 0.71 mg/dL (ref 0.44–1.00)
GFR calc Af Amer: >60 mL/min (ref >60)
GFR calc non Af Amer: >60 mL/min (ref >60)
GLUCOSE: 93 mg/dL (ref 65–99)
POTASSIUM: 4.2 mmol/L (ref 3.5–5.1)
Sodium: 142 mmol/L (ref 135–145)

## 2016-08-27 ENCOUNTER — Ambulatory Visit: Payer: Medicare Other | Admitting: Anesthesiology

## 2016-08-27 ENCOUNTER — Encounter
Admission: RE | Disposition: A | Discharge status: Home / Self Care | Payer: Self-pay | Source: Ambulatory Visit | Attending: Orthopedic Surgery

## 2016-08-27 ENCOUNTER — Ambulatory Visit
Admission: RE | Admit: 2016-08-27 | Discharge: 2016-08-27 | Disposition: A | Discharge status: Home / Self Care | Payer: Medicare Other | Source: Ambulatory Visit | Attending: Orthopedic Surgery | Admitting: Orthopedic Surgery

## 2016-08-27 DIAGNOSIS — M1990 Unspecified osteoarthritis, unspecified site: Secondary | ICD-10-CM | POA: Insufficient documentation

## 2016-08-27 DIAGNOSIS — I10 Essential (primary) hypertension: Secondary | ICD-10-CM | POA: Insufficient documentation

## 2016-08-27 DIAGNOSIS — Z791 Long term (current) use of non-steroidal anti-inflammatories (NSAID): Secondary | ICD-10-CM | POA: Insufficient documentation

## 2016-08-27 DIAGNOSIS — T8484XA Pain due to internal orthopedic prosthetic devices, implants and grafts, initial encounter: Secondary | ICD-10-CM | POA: Insufficient documentation

## 2016-08-27 DIAGNOSIS — Z85828 Personal history of other malignant neoplasm of skin: Secondary | ICD-10-CM | POA: Insufficient documentation

## 2016-08-27 DIAGNOSIS — Z79891 Long term (current) use of opiate analgesic: Secondary | ICD-10-CM | POA: Insufficient documentation

## 2016-08-27 DIAGNOSIS — Y831 Surgical operation with implant of artificial internal device as the cause of abnormal reaction of the patient, or of later complication, without mention of misadventure at the time of the procedure: Secondary | ICD-10-CM | POA: Insufficient documentation

## 2016-08-27 DIAGNOSIS — Z87891 Personal history of nicotine dependence: Secondary | ICD-10-CM | POA: Insufficient documentation

## 2016-08-27 DIAGNOSIS — Z881 Allergy status to other antibiotic agents status: Secondary | ICD-10-CM | POA: Insufficient documentation

## 2016-08-27 DIAGNOSIS — K219 Gastro-esophageal reflux disease without esophagitis: Secondary | ICD-10-CM | POA: Insufficient documentation

## 2016-08-27 DIAGNOSIS — Z79899 Other long term (current) drug therapy: Secondary | ICD-10-CM | POA: Insufficient documentation

## 2016-08-27 DIAGNOSIS — F329 Major depressive disorder, single episode, unspecified: Secondary | ICD-10-CM | POA: Insufficient documentation

## 2016-08-27 DIAGNOSIS — F419 Anxiety disorder, unspecified: Secondary | ICD-10-CM | POA: Insufficient documentation

## 2016-08-27 HISTORY — PX: HARDWARE REMOVAL: SHX979

## 2016-08-27 LAB — URINE DRUG SCREEN, QUALITATIVE (ARMC ONLY)
AMPHETAMINES, UR SCREEN: NOT DETECTED
Barbiturates, Ur Screen: POSITIVE — AB
Benzodiazepine, Ur Scrn: POSITIVE — AB
COCAINE METABOLITE, UR ~~LOC~~: NOT DETECTED
Cannabinoid 50 Ng, Ur ~~LOC~~: NOT DETECTED
MDMA (ECSTASY) UR SCREEN: NOT DETECTED
METHADONE SCREEN, URINE: NOT DETECTED
OPIATE, UR SCREEN: NOT DETECTED
Phencyclidine (PCP) Ur S: NOT DETECTED
Tricyclic, Ur Screen: NOT DETECTED

## 2016-08-27 SURGERY — REMOVAL, HARDWARE
Anesthesia: General | Laterality: Left

## 2016-08-27 MED ORDER — FENTANYL CITRATE (PF) 100 MCG/2ML IJ SOLN
INTRAMUSCULAR | Status: DC | PRN
  Administered 2016-08-27 (×2): 25 ug via INTRAVENOUS

## 2016-08-27 MED ORDER — HYDROCODONE-ACETAMINOPHEN 5-325 MG PO TABS: 1.0000 | ORAL_TABLET | Freq: Four times a day (QID) | ORAL | 0 refills | Status: DC | PRN

## 2016-08-27 MED ORDER — CHLORHEXIDINE GLUCONATE CLOTH 2 % EX PADS: 6.0000 | MEDICATED_PAD | Freq: Once | CUTANEOUS | Status: DC

## 2016-08-27 MED ORDER — FENTANYL CITRATE (PF) 100 MCG/2ML IJ SOLN
INTRAMUSCULAR | Status: AC
  Filled 2016-08-27: qty 2

## 2016-08-27 MED ORDER — LACTATED RINGERS IV SOLN
INTRAVENOUS | Status: DC
  Administered 2016-08-27: 1000 mL via INTRAVENOUS

## 2016-08-27 MED ORDER — BUPIVACAINE HCL (PF) 0.5 % IJ SOLN
INTRAMUSCULAR | Status: AC
Start: 2016-08-27 — End: 2016-08-27
  Filled 2016-08-27: qty 30

## 2016-08-27 MED ORDER — PROPOFOL 10 MG/ML IV BOLUS
INTRAVENOUS | Status: DC | PRN
  Administered 2016-08-27: 30 mg via INTRAVENOUS

## 2016-08-27 MED ORDER — FENTANYL CITRATE (PF) 100 MCG/2ML IJ SOLN
INTRAMUSCULAR | Status: DC
Start: 2016-08-27 — End: 2016-08-27
  Filled 2016-08-27: qty 2

## 2016-08-27 MED ORDER — FAMOTIDINE 20 MG PO TABS
ORAL_TABLET | ORAL | Status: AC
  Administered 2016-08-27: 20 mg via ORAL
  Filled 2016-08-27: qty 1

## 2016-08-27 MED ORDER — DEXAMETHASONE SODIUM PHOSPHATE 10 MG/ML IJ SOLN
INTRAMUSCULAR | Status: AC
  Filled 2016-08-27: qty 1

## 2016-08-27 MED ORDER — PHENYLEPHRINE HCL 10 MG/ML IJ SOLN
INTRAMUSCULAR | Status: AC
  Filled 2016-08-27: qty 1

## 2016-08-27 MED ORDER — PROPOFOL 500 MG/50ML IV EMUL
INTRAVENOUS | Status: DC | PRN
  Administered 2016-08-27: 75 ug/kg/min via INTRAVENOUS

## 2016-08-27 MED ORDER — FAMOTIDINE 20 MG PO TABS
20.0000 mg | ORAL_TABLET | Freq: Once | ORAL | Status: AC
  Administered 2016-08-27: 20 mg via ORAL

## 2016-08-27 MED ORDER — NEOMYCIN-POLYMYXIN B GU 40-200000 IR SOLN
Status: DC | PRN
  Administered 2016-08-27: 4 mL

## 2016-08-27 MED ORDER — MIDAZOLAM HCL 2 MG/2ML IJ SOLN
INTRAMUSCULAR | Status: AC
  Filled 2016-08-27: qty 2

## 2016-08-27 MED ORDER — NEOMYCIN-POLYMYXIN B GU 40-200000 IR SOLN
Status: AC
  Filled 2016-08-27: qty 4

## 2016-08-27 MED ORDER — BUPIVACAINE HCL 0.5 % IJ SOLN
INTRAMUSCULAR | Status: DC | PRN
  Administered 2016-08-27: 10 mL

## 2016-08-27 MED ORDER — FENTANYL CITRATE (PF) 100 MCG/2ML IJ SOLN
25.0000 ug | INTRAMUSCULAR | Status: DC | PRN
  Administered 2016-08-27: 25 ug via INTRAVENOUS

## 2016-08-27 MED ORDER — ONDANSETRON HCL 4 MG/2ML IJ SOLN: 4.0000 mg | Freq: Once | INTRAMUSCULAR | Status: DC | PRN

## 2016-08-27 MED ORDER — PROPOFOL 10 MG/ML IV BOLUS
INTRAVENOUS | Status: AC
  Filled 2016-08-27: qty 20

## 2016-08-27 MED ORDER — LIDOCAINE HCL (PF) 2 % IJ SOLN
INTRAMUSCULAR | Status: AC
  Filled 2016-08-27: qty 2

## 2016-08-27 MED ORDER — LIDOCAINE HCL (PF) 1 % IJ SOLN
INTRAMUSCULAR | Status: AC
  Filled 2016-08-27: qty 30

## 2016-08-27 MED ORDER — BUPIVACAINE-EPINEPHRINE (PF) 0.25% -1:200000 IJ SOLN: INTRAMUSCULAR | Status: DC | PRN

## 2016-08-27 MED ORDER — LIDOCAINE HCL (CARDIAC) 20 MG/ML IV SOLN
INTRAVENOUS | Status: DC | PRN
  Administered 2016-08-27: 50 mg via INTRAVENOUS

## 2016-08-27 MED ORDER — EPHEDRINE SULFATE 50 MG/ML IJ SOLN
INTRAMUSCULAR | Status: AC
  Filled 2016-08-27: qty 1

## 2016-08-27 MED ORDER — ONDANSETRON HCL 4 MG/2ML IJ SOLN
INTRAMUSCULAR | Status: AC
  Filled 2016-08-27: qty 2

## 2016-08-27 MED ORDER — CEFAZOLIN SODIUM-DEXTROSE 2-4 GM/100ML-% IV SOLN
INTRAVENOUS | Status: AC
  Filled 2016-08-27: qty 100

## 2016-08-27 MED ORDER — MIDAZOLAM HCL 2 MG/2ML IJ SOLN
INTRAMUSCULAR | Status: DC | PRN
  Administered 2016-08-27: 2 mg via INTRAVENOUS

## 2016-08-27 MED ORDER — SODIUM CHLORIDE 0.9 % IJ SOLN
INTRAMUSCULAR | Status: AC
  Filled 2016-08-27: qty 10

## 2016-08-27 MED ORDER — CEFAZOLIN SODIUM-DEXTROSE 2-4 GM/100ML-% IV SOLN
2.0000 g | INTRAVENOUS | Status: AC
  Administered 2016-08-27: 2 g via INTRAVENOUS

## 2016-08-27 MED ORDER — BUPIVACAINE HCL (PF) 0.25 % IJ SOLN
INTRAMUSCULAR | Status: AC
  Filled 2016-08-27: qty 30

## 2016-08-27 MED ORDER — ONDANSETRON HCL 4 MG PO TABS: 4.0000 mg | ORAL_TABLET | Freq: Three times a day (TID) | ORAL | 0 refills | Status: DC | PRN

## 2016-08-27 MED ORDER — LIDOCAINE HCL 1 % IJ SOLN
INTRAMUSCULAR | Status: DC | PRN
  Administered 2016-08-27: 4 mL

## 2016-08-27 SURGICAL SUPPLY — 39 items
BLADE SURG 15 STRL LF DISP TIS (BLADE) ×1 IMPLANT
BLADE SURG 15 STRL SS (BLADE) ×2
BNDG COHESIVE 3X5 WHT NS (GAUZE/BANDAGES/DRESSINGS) ×3 IMPLANT
BNDG ESMARK 6X12 TAN STRL LF (GAUZE/BANDAGES/DRESSINGS) ×3 IMPLANT
CANISTER SUCT 1200ML W/VALVE (MISCELLANEOUS) ×3 IMPLANT
CLOSURE WOUND 1/2 X4 (GAUZE/BANDAGES/DRESSINGS) ×1
CUFF TOURN 18 STER (MISCELLANEOUS) IMPLANT
CUFF TOURN 24 STER (MISCELLANEOUS) IMPLANT
DRAPE FLUOR MINI C-ARM 54X84 (DRAPES) ×3 IMPLANT
DRAPE INCISE IOBAN 66X45 STRL (DRAPES) ×3 IMPLANT
DRAPE U-SHAPE 47X51 STRL (DRAPES) ×3 IMPLANT
DURAPREP 26ML APPLICATOR (WOUND CARE) ×3 IMPLANT
ELECT REM PT RETURN 9FT ADLT (ELECTROSURGICAL) ×3
ELECTRODE REM PT RTRN 9FT ADLT (ELECTROSURGICAL) ×1 IMPLANT
GAUZE PETRO XEROFOAM 1X8 (MISCELLANEOUS) ×3 IMPLANT
GAUZE SPONGE 4X4 12PLY STRL (GAUZE/BANDAGES/DRESSINGS) ×3 IMPLANT
GAUZE XEROFORM 4X4 STRL (GAUZE/BANDAGES/DRESSINGS) ×3 IMPLANT
GLOVE BIOGEL PI IND STRL 9 (GLOVE) ×1 IMPLANT
GLOVE BIOGEL PI INDICATOR 9 (GLOVE) ×2
GLOVE SURG 9.0 ORTHO LTXF (GLOVE) ×15 IMPLANT
GOWN STRL REUS TWL 2XL XL LVL4 (GOWN DISPOSABLE) ×3 IMPLANT
GOWN STRL REUS W/ TWL LRG LVL3 (GOWN DISPOSABLE) ×1 IMPLANT
GOWN STRL REUS W/TWL LRG LVL3 (GOWN DISPOSABLE) ×2
KIT RM TURNOVER STRD PROC AR (KITS) ×3 IMPLANT
LABEL OR SOLS (LABEL) ×3 IMPLANT
NS IRRIG 1000ML POUR BTL (IV SOLUTION) ×3 IMPLANT
PACK EXTREMITY ARMC (MISCELLANEOUS) ×3 IMPLANT
PAD ABD DERMACEA PRESS 5X9 (GAUZE/BANDAGES/DRESSINGS) ×3 IMPLANT
PAD CAST CTTN 4X4 STRL (SOFTGOODS) ×1 IMPLANT
PADDING CAST COTTON 4X4 STRL (SOFTGOODS) ×2
SPLINT CAST 1 STEP 4X30 (MISCELLANEOUS) IMPLANT
SPONGE LAP 18X18 5 PK (GAUZE/BANDAGES/DRESSINGS) ×3 IMPLANT
STAPLER SKIN PROX 35W (STAPLE) ×3 IMPLANT
STOCKINETTE STRL 6IN 960660 (GAUZE/BANDAGES/DRESSINGS) ×3 IMPLANT
STRIP CLOSURE SKIN 1/2X4 (GAUZE/BANDAGES/DRESSINGS) ×2 IMPLANT
SUT VIC AB 2-0 SH 27 (SUTURE) ×2
SUT VIC AB 2-0 SH 27XBRD (SUTURE) ×1 IMPLANT
SYR 30ML LL (SYRINGE) ×3 IMPLANT
TAPE MICROPORE 2IN (TAPE) ×3 IMPLANT

## 2016-08-27 NOTE — Anesthesia Post-op Follow-up Note (Signed)
Anesthesia QCDR form completed.        

## 2016-08-27 NOTE — Progress Notes (Signed)
Flushed right eye with saline   Pt states it feels better

## 2016-08-27 NOTE — Progress Notes (Signed)
Right eye better  Still hurts a little  states she sleeps with her eyes open at night and usually takes a day for it to feel better  Pt states she will be fine  No request for any other action

## 2016-08-27 NOTE — H&P (Signed)
PREOPERATIVE H&P  Chief Complaint: Painful hardware left ankle  HPI: Margaret Warner is a 66 y.o. female who presents for preoperative history and physical with a diagnosis of painful hardward after ORIF of left ankle trimalleolar fracture on 02/28/16.  Patient's fracture is well healed, but she is having pain over the lateral malleolus due to two anterior screws.  She has elected for surgical management to remove the screws.   Past Medical History:  Diagnosis Date  . Anemia   . Anxiety   . Arthritis    HANDS, BACK AND KNEES  . Complication of anesthesia    WITH OOPHORECTOMY  . Depression   . GERD (gastroesophageal reflux disease)    RARE-NO MEDS  . Headache   . Hypertension   . Skin cancer    BASAL AND SQUAMOUS CELL   Past Surgical History:  Procedure Laterality Date  . BACK SURGERY     LUMBAR  . BILATERAL OOPHORECTOMY    . BREAST BIOPSY Left 2011   EXCISIONAL - NEG  . FACIAL COSMETIC SURGERY    . ORIF ANKLE FRACTURE Left 02/26/2016   Procedure: OPEN REDUCTION INTERNAL FIXATION (ORIF) ANKLE FRACTURE;  Surgeon: Thornton Park, MD;  Location: [facility] ORS;  Service: Orthopedics;  Laterality: Left;   Social History   Social History  . Marital status: Divorced    Spouse name: N/A  . Number of children: N/A  . Years of education: N/A   Social History Main Topics  . Smoking status: Former Smoker    Packs/day: 2.00    Years: 15.00    Types: Cigarettes    Quit date: 02/25/2004  . Smokeless tobacco: Never Used  . Alcohol use Yes     Comment: 1 drink weekly  . Drug use: Yes    Types: Marijuana     Comment: OCC  . Sexual activity: Yes   Other Topics Concern  . None   Social History Narrative  . None   Family History  Problem Relation Age of Onset  . Breast cancer Neg Hx    Allergies  Allergen Reactions  . Tetracycline Swelling and Rash   Prior to Admission medications   Medication Sig Start Date End Date Taking? Authorizing Provider  acetaminophen  (TYLENOL) 325 MG tablet Take 650-975 mg by mouth daily as needed for moderate pain or headache.   Yes [provider]  ALPRAZolam (XANAX) 0.25 MG tablet Take 0.25 mg by mouth 2 (two) times daily as needed for anxiety.    Yes [provider]  ASPERCREME W/LIDOCAINE EX Apply 1 application topically daily as needed (pain).   Yes [provider]  buPROPion (WELLBUTRIN XL) 300 MG 24 hr tablet Take 300 mg by mouth every morning.  01/28/16  Yes [provider]  butalbital-acetaminophen-caffeine (FIORICET, ESGIC) 50-325-40 MG tablet Take 2 tablets by mouth every 4 (four) hours as needed for headache.  02/05/16  Yes [provider]  COMBIPATCH 0.05-0.14 MG/DAY Place 1 patch onto the skin 2 (two) times a week. Sundays and Thursdays 02/04/16  Yes [provider]  DULoxetine (CYMBALTA) 30 MG capsule Take 30 mg by mouth every morning.  12/14/15  Yes [provider]  fluticasone (FLONASE) 50 MCG/ACT nasal spray Place 1 spray into both nostrils daily.   Yes [provider]  gabapentin (NEURONTIN) 300 MG capsule Take 300 mg by mouth 2 (two) times daily.  02/22/16  Yes [provider]  HYDROcodone-acetaminophen (NORCO/VICODIN) 5-325 MG tablet Take 1 tablet by mouth 2 (  two) times daily as needed for moderate pain or severe pain.   Yes [provider]  ibuprofen (ADVIL,MOTRIN) 200 MG tablet Take 600 mg by mouth daily as needed for headache or moderate pain.   Yes [provider]  losartan-hydrochlorothiazide (HYZAAR) 100-12.5 MG tablet Take 1 tablet by mouth every morning.  06/20/16  Yes [provider]  neomycin-bacitracin-polymyxin (NEOSPORIN) ointment Apply 1 application topically daily as needed for wound care.   Yes [provider]  rosuvastatin (CRESTOR) 10 MG tablet Take 10 mg by mouth every other day. In the evening 12/25/15  Yes [provider]  tiZANidine (ZANAFLEX) 4 MG tablet Take 1 mg by  mouth 3 (three) times daily as needed for muscle spasms.    Yes [provider]     Positive ROS: All other systems have been reviewed and were otherwise negative with the exception of those mentioned in the HPI and as above.  Physical Exam: General: Alert, no acute distress  MUSCULOSKELETAL: Left ankle:  Skin intact.   Incisions well healed.  Palpable anterior screws x 2.  No osseous tenderness.  NVI.   Intact motor function.    Assessment: Painful hardware left ankle.  Plan: Plan for Procedure(s): HARDWARE REMOVAL, LEFT ANKLE  I explained the details of the operation and post-op course to the patient in detail.   I also discussed the risks and benefits of surgery. She understands risks include but are not limited to infection, bleeding requiring blood transfusion, nerve or blood vessel injury, joint stiffness or loss of motion, persistent pain, weakness or instability, malunion, nonunion and hardware failure and the need for further surgery. Medical risks include but are not limited to DVT and pulmonary embolism, myocardial infarction, stroke, pneumonia, respiratory failure and death. Patient understood these risks and wished to proceed.    Thornton Park, MD   08/27/2016 7:44 AM

## 2016-08-27 NOTE — Discharge Instructions (Signed)

## 2016-08-27 NOTE — Progress Notes (Signed)
Right eye hurts  Feels dry

## 2016-08-27 NOTE — Anesthesia Procedure Notes (Addendum)
Performed by: Danetta Prom Pre-anesthesia Checklist: Patient identified, Emergency Drugs available, Suction available, Patient being monitored and Timeout performed Patient Re-evaluated:Patient Re-evaluated prior to induction Oxygen Delivery Method: Simple face mask Preoxygenation: Pre-oxygenation with 100% oxygen Induction Type: IV induction       

## 2016-08-27 NOTE — Anesthesia Preprocedure Evaluation (Signed)
Anesthesia Evaluation  Patient identified by MRN, date of birth, ID band Patient awake    Reviewed: Allergy & Precautions, H&P , NPO status , Patient's Chart, lab work & pertinent test results, reviewed documented beta blocker date and time   History of Anesthesia Complications (+) PONV, PROLONGED EMERGENCE and history of anesthetic complications  Airway Mallampati: II  TM Distance: >3 FB Neck ROM: full    Dental  (+) Teeth Intact   Pulmonary neg pulmonary ROS, former smoker,    Pulmonary exam normal        Cardiovascular Exercise Tolerance: Good hypertension, On Medications negative cardio ROS Normal cardiovascular exam Rate:Normal     Neuro/Psych  Headaches, negative neurological ROS  negative psych ROS   GI/Hepatic negative GI ROS, Neg liver ROS, GERD  Medicated,  Endo/Other  negative endocrine ROS  Renal/GU negative Renal ROS  negative genitourinary   Musculoskeletal   Abdominal   Peds  Hematology negative hematology ROS (+) anemia ,   Anesthesia Other Findings   Reproductive/Obstetrics negative OB ROS                             Anesthesia Physical Anesthesia Plan  ASA: II  Anesthesia Plan: General LMA   Post-op Pain Management:    Induction:   PONV Risk Score and Plan: 3 and Ondansetron, Dexamethasone, Midazolam and Propofol infusion  Airway Management Planned:   Additional Equipment:   Intra-op Plan:   Post-operative Plan:   Informed Consent: I have reviewed the patients History and Physical, chart, labs and discussed the procedure including the risks, benefits and alternatives for the proposed anesthesia with the patient or authorized representative who has indicated his/her understanding and acceptance.     Plan Discussed with: CRNA  Anesthesia Plan Comments:         Anesthesia Quick Evaluation

## 2016-08-27 NOTE — Op Note (Signed)
08/27/2016  9:13 AM  PATIENT:  Margaret Warner    PRE-OPERATIVE DIAGNOSIS:  Painful hardware left ankle  POST-OPERATIVE DIAGNOSIS:  Same  PROCEDURE:  HARDWARE REMOVAL LEFT ANKLE  SURGEON:  Thornton Park, MD  ANESTHESIA:   General  PREOPERATIVE INDICATIONS:  Margaret Warner is a  66 y.o. female with a diagnosis of painful hardware, left ankle who failed conservative measures and elected for surgical management.    I discussed the risks and benefits of surgery. The risks include but are not limited to infection, bleeding requiring blood transfusion, nerve or blood vessel injury, joint stiffness or loss of motion, persistent pain, weakness or instability, malunion, nonunion and hardware failure and the need for further surgery. Medical risks include but are not limited to DVT and pulmonary embolism, myocardial infarction, stroke, pneumonia, respiratory failure and death. Patient understood these risks and wished to proceed.    OPERATIVE IMPLANTS: None  OPERATIVE FINDINGS: Prominent distal screws over lateral malleolus  OPERATIVE PROCEDURE: Patient was met in the preoperative area. A preoperative history and physical was performed. The left ankle was marked according to the hospital's correct site of surgery recall. She was then brought to the operating room where she was placed supine on the operative table. She underwent sedation with a face mask.  Patient was prepped and draped in a sterile fashion. Timeout was performed to verify the patient's name, date of birth, medical record number, correct site of surgery and correct procedure to be performed. The timeout was also used to confirm the patient received antibiotics now appropriate instruments and radiographically studies were available in the room. Once all in attendance were in agreement, the case began.  Patient had local anesthetic applied to the operative site with 1% lidocaine plain 4 cc.  A FluoroScan was used to  identify the position of the hardware. 2 small vertical incisions were made, one over the 2 anterior screws and one incision over the lateral screws. The anterior screws were painful for the patient. The lateral 2 screws were also very prominent and palpable the decision was made to remove them as well. This subcutaneous tissue was dissected using a hemostat and Bovie. The screws were easily identified. A small fragment screwdriver was used to back out all the screws. These were washed and collected for the patient. The 2 incisions were then copiously irrigated and closed with 4-0 nylon suture.  Patient had a dry sterile dressing applied including Steri-Strips Xeroform, 4 x 4's, an ABD, Kerlix and an Ace wrap. Patient may be weightbearing as tolerated after this or seizure. She is encouraged to ice and elevate her ankle whenever possible over the next 3 days. She'll follow-up with me in the office in 1 week. I spoke with the patient's family postop consultation room to let her know the case it been performed without complication the patient was stable in recovery room.

## 2016-08-27 NOTE — Transfer of Care (Signed)
Immediate Anesthesia Transfer of Care Note  Patient: Margaret Warner  Procedure(s) Performed: Procedure(s): HARDWARE REMOVAL (Left)  Patient Location: PACU  Anesthesia Type:General  Level of Consciousness: awake and responds to stimulation  Airway & Oxygen Therapy: Patient Spontanous Breathing and Patient connected to face mask oxygen  Post-op Assessment: Report given to RN and Post -op Vital signs reviewed and stable  Post vital signs: Reviewed and stable  Last Vitals:  Vitals:   08/27/16 0615 08/27/16 0903  BP: (!) 129/54 121/77  Pulse: 68 (!) 54  Resp: 14 (!) 8  Temp: (!) 36.3 C   SpO2: 97% 100%    Last Pain:  Vitals:   08/27/16 0615  TempSrc: Tympanic         Complications: No apparent anesthesia complications

## 2016-08-28 ENCOUNTER — Encounter: Payer: Self-pay | Admitting: Orthopedic Surgery

## 2016-08-29 NOTE — Anesthesia Postprocedure Evaluation (Signed)
Anesthesia Post Note  Patient: Rashan Patient Romain  Procedure(s) Performed: Procedure(s) (LRB): HARDWARE REMOVAL (Left)  Patient location during evaluation: PACU Anesthesia Type: General Level of consciousness: awake and alert Pain management: pain level controlled Vital Signs Assessment: post-procedure vital signs reviewed and stable Respiratory status: spontaneous breathing, nonlabored ventilation, respiratory function stable and patient connected to nasal cannula oxygen Cardiovascular status: blood pressure returned to baseline and stable Postop Assessment: no signs of nausea or vomiting Anesthetic complications: no     Last Vitals:  Vitals:   08/27/16 0959 08/27/16 1025  BP: (!) 144/76 (!) 142/59  Pulse: (!) 51 (!) 54  Resp: 14 16  Temp: (!) 36.4 C   SpO2: 98% 100%    Last Pain:  Vitals:   08/28/16 0845  TempSrc:   PainSc: [place]

## 2016-09-02 ENCOUNTER — Other Ambulatory Visit: Payer: Self-pay | Admitting: Internal Medicine

## 2016-09-02 DIAGNOSIS — Z1231 Encounter for screening mammogram for malignant neoplasm of breast: Secondary | ICD-10-CM

## 2016-09-19 ENCOUNTER — Ambulatory Visit
Admission: RE | Admit: 2016-09-19 | Discharge: 2016-09-19 | Disposition: A | Discharge status: Home / Self Care | Payer: Medicare Other | Source: Ambulatory Visit | Attending: Internal Medicine | Admitting: Internal Medicine

## 2016-09-19 DIAGNOSIS — Z1231 Encounter for screening mammogram for malignant neoplasm of breast: Secondary | ICD-10-CM | POA: Diagnosis not present

## 2017-07-02 ENCOUNTER — Ambulatory Visit (INDEPENDENT_AMBULATORY_CARE_PROVIDER_SITE_OTHER): Payer: Medicare Other | Admitting: Podiatry

## 2017-07-02 ENCOUNTER — Encounter

## 2017-07-02 ENCOUNTER — Ambulatory Visit (INDEPENDENT_AMBULATORY_CARE_PROVIDER_SITE_OTHER): Payer: Medicare Other

## 2017-07-02 ENCOUNTER — Encounter: Payer: Self-pay | Admitting: Podiatry

## 2017-07-02 DIAGNOSIS — M779 Enthesopathy, unspecified: Secondary | ICD-10-CM | POA: Diagnosis not present

## 2017-07-02 DIAGNOSIS — M778 Other enthesopathies, not elsewhere classified: Secondary | ICD-10-CM

## 2017-07-02 NOTE — Progress Notes (Signed)
She presents today for chief complaint of pain to the second metatarsal phalangeal jointOf the left foot times about 3 months.  She still reports a sharp pain when her toe hits the ground to walk she states that it comes and goes and she is used different pads.  Objective: Vital signs are stable she is alert and oriented x3.  Pulses are palpable.  Neurologic sensorium is intact cannot rule out a neuroma in this area.  She has pain on palpation and range of motion of the second metatarsal phalangeal joint.  Radiographs demonstrate elongated second metatarsal soft tissue edema around that area.  Assessment: Capsulitis cannot rule out neuroma second interdigital space and second metatarsal phalangeal joint left foot.  Plan: Discussed etiology pathology conservative versus surgical therapies at this point I injected 5 mg of Kenalog and local anesthetic surrounding the joint.  Placed her in a Darco shoe recommended that if she is not improved in 3 weeks to notify us.

## 2017-08-06 ENCOUNTER — Encounter: Payer: Self-pay | Admitting: Podiatry

## 2017-08-06 ENCOUNTER — Ambulatory Visit (INDEPENDENT_AMBULATORY_CARE_PROVIDER_SITE_OTHER): Payer: Medicare Other | Admitting: Podiatry

## 2017-08-06 DIAGNOSIS — G5762 Lesion of plantar nerve, left lower limb: Secondary | ICD-10-CM

## 2017-08-06 NOTE — Progress Notes (Signed)
Presents today for a follow-up of capsulitis.  She states that the capsulitis is still painful she states that her shoe gear is absolutely backwards from what I told her she states that the stiff soled shoes hurt worse and the soft shoes feel better.  So at this point she does not feel that it capsulitis.  She states that she has radiating pain about the toes.  Objective: Vital signs are stable she is alert and oriented x3.  Palpable Mulder's click to the second and third interdigital space of the left foot.  Assessment: I feel that she is probably right that it is less likely capsulitis at this point more likely neuroma to the second interdigital space left second third interdigital space.  Plan: Discussed etiology pathology conservative versus surgical therapies.  At this point I offered her cortisone injection but we had also talked about the alcohol injections she chose to go ahead with the alcohol injection so we did that after sterile Betadine skin prep 2 cc 4% dehydrated alcohol.  Tolerated procedure well without comp occasions.

## 2017-08-27 ENCOUNTER — Ambulatory Visit (INDEPENDENT_AMBULATORY_CARE_PROVIDER_SITE_OTHER): Payer: Medicare Other | Admitting: Podiatry

## 2017-08-27 ENCOUNTER — Other Ambulatory Visit: Payer: Self-pay | Admitting: Internal Medicine

## 2017-08-27 ENCOUNTER — Encounter: Payer: Self-pay | Admitting: Podiatry

## 2017-08-27 DIAGNOSIS — G5762 Lesion of plantar nerve, left lower limb: Secondary | ICD-10-CM | POA: Diagnosis not present

## 2017-08-27 DIAGNOSIS — Z1231 Encounter for screening mammogram for malignant neoplasm of breast: Secondary | ICD-10-CM

## 2017-08-27 NOTE — Progress Notes (Signed)
She presents today for follow-up of her neuroma she says at this point I really cannot tell much of a difference as she refers to the second interdigital  space of the left foot.  Objective: Vital signs are stable she is alert and oriented x3.  Still has a palpable Mulder's click to the second interdigital space of the right foot.  Assessment: Neuroma second interdigital space of the right foot.  Plan: After sterile Betadine skin prep I injected 2 cc of 4% dehydrated alcohol and local anesthetic to the second interdigital space which she tolerated well.  I will follow-up with her in 3 to 4 weeks to make sure that

## 2017-09-23 ENCOUNTER — Ambulatory Visit
Admission: RE | Admit: 2017-09-23 | Discharge: 2017-09-23 | Disposition: A | Discharge status: Home / Self Care | Payer: Medicare Other | Source: Ambulatory Visit | Attending: Internal Medicine | Admitting: Internal Medicine

## 2017-09-23 DIAGNOSIS — Z1231 Encounter for screening mammogram for malignant neoplasm of breast: Secondary | ICD-10-CM | POA: Diagnosis present

## 2017-09-24 ENCOUNTER — Ambulatory Visit (INDEPENDENT_AMBULATORY_CARE_PROVIDER_SITE_OTHER): Payer: Medicare Other | Admitting: Podiatry

## 2017-09-24 ENCOUNTER — Encounter: Payer: Self-pay | Admitting: Podiatry

## 2017-09-24 DIAGNOSIS — G5762 Lesion of plantar nerve, left lower limb: Secondary | ICD-10-CM

## 2017-09-24 NOTE — Progress Notes (Signed)
She presents today for follow-up of neuroma to the left foot states is really not a whole lot better than it was.  She states that is still painful when I walk.  Objective: Vital signs are stable she is alert and oriented x3 pain on palpation third interdigital space left foot.  Assessment: Neuroma third interdigital space left.  Plan: Injected her third dose of dehydrated alcohol after sterile Betadine skin prep tolerated procedure well follow-up with her in 3 weeks

## 2017-10-20 ENCOUNTER — Encounter: Payer: Self-pay | Admitting: Podiatry

## 2017-10-20 ENCOUNTER — Ambulatory Visit (INDEPENDENT_AMBULATORY_CARE_PROVIDER_SITE_OTHER): Payer: Medicare Other | Admitting: Podiatry

## 2017-10-20 DIAGNOSIS — G5762 Lesion of plantar nerve, left lower limb: Secondary | ICD-10-CM | POA: Diagnosis not present

## 2017-10-20 NOTE — Progress Notes (Signed)
She presents today for follow-up of her neuroma second interdigital space of her left foot.  She states that it seems to be doing a little bit better she has not a lot of pain on palpation today to the second or to the third interdigital space for that matter.  Objective: Vital signs are stable she is alert and oriented x3 no erythema edema saline strange odor palpable Mulder's click second interdigital space of the left foot.  Assessment: Neuroma second interspace left foot.  Plan: Discussed etiology pathology conservative surgical therapies at this point time I injected another dose of dehydrated alcohol to her second interdigital space of her left foot she tolerated procedure well after alcohol skin prep and I will follow-up with her in 3 weeks probably in the [place] office because it appears that we are both here.

## 2017-11-11 ENCOUNTER — Ambulatory Visit (INDEPENDENT_AMBULATORY_CARE_PROVIDER_SITE_OTHER): Payer: Medicare Other | Admitting: Podiatry

## 2017-11-11 ENCOUNTER — Encounter: Payer: Self-pay | Admitting: Podiatry

## 2017-11-11 DIAGNOSIS — G5762 Lesion of plantar nerve, left lower limb: Secondary | ICD-10-CM | POA: Diagnosis not present

## 2017-11-11 NOTE — Progress Notes (Signed)
She presents today for follow-up of neuroma to the second interdigital space of her left foot.  States she is approximately 40% improved.  Objective: Vital signs stable she has pain on palpation second interdigital space of her left foot.  Pulses are palpable.  Assessment: Pain in limb secondary to neuroma second interdigital space.  Plan: After sterile Betadine skin prep I injected 2 cc 4% dehydrated alcohol and local anesthetic to the second interspace of the left foot.  Follow-up with her in 3 weeks

## 2017-11-25 ENCOUNTER — Encounter: Payer: Self-pay | Admitting: Podiatry

## 2017-11-25 ENCOUNTER — Ambulatory Visit (INDEPENDENT_AMBULATORY_CARE_PROVIDER_SITE_OTHER): Payer: Medicare Other | Admitting: Podiatry

## 2017-11-25 DIAGNOSIS — G5762 Lesion of plantar nerve, left lower limb: Secondary | ICD-10-CM | POA: Diagnosis not present

## 2017-11-26 NOTE — Progress Notes (Signed)
Presents today for follow-up of neuroma second interdigital space of the left foot states that is getting much better.  Objective: Vital signs are stable alert and oriented x3.  Pulses are palpable.  Neurologic sensorium is intact today the majority of the pain is in the third interdigital space to some degree in the second but worse in the third.  Palpable Mulder's click is noted at each site.  Assessment: Neuroma interdigital space left third.  Plan: Injected that area today with 2 cc 4% dehydrated alcohol and local anesthetic after sterile band skin prep tolerated procedure well without complications follow-up with her in 3 to 4 weeks.

## 2017-11-27 ENCOUNTER — Ambulatory Visit: Payer: BC Managed Care – PPO | Admitting: Podiatry

## 2017-12-16 ENCOUNTER — Ambulatory Visit (INDEPENDENT_AMBULATORY_CARE_PROVIDER_SITE_OTHER): Payer: Medicare Other | Admitting: Podiatry

## 2017-12-16 ENCOUNTER — Encounter: Payer: Self-pay | Admitting: Podiatry

## 2017-12-16 DIAGNOSIS — G5762 Lesion of plantar nerve, left lower limb: Secondary | ICD-10-CM

## 2017-12-16 NOTE — Progress Notes (Signed)
She presents today for follow-up of neuroma to the second interdigital space of the left foot..  She states that is actually feeling better despite the bruise.  Objective: Vital signs are stable she is alert and oriented x3 she still has less pain in the second intermetatarsal space that she does in the third intermetatarsal space as of today.  Assessment: Neuroma second and third intermetatarsal spaces second metatarsal space resolving third metatarsal base still symptomatic.  Plan: After sterile Betadine skin prep I injected 2 cc 4% dehydrated alcohol into the third interdigital space of the left foot.  Follow-up with her in 3 to 4 weeks for reevaluation make sure she is doing well.

## 2018-01-14 ENCOUNTER — Encounter: Payer: Self-pay | Admitting: Podiatry

## 2018-01-14 ENCOUNTER — Ambulatory Visit (INDEPENDENT_AMBULATORY_CARE_PROVIDER_SITE_OTHER): Payer: Medicare Other | Admitting: Podiatry

## 2018-01-14 DIAGNOSIS — G5762 Lesion of plantar nerve, left lower limb: Secondary | ICD-10-CM | POA: Diagnosis not present

## 2018-01-14 NOTE — Progress Notes (Signed)
She presents today for follow-up of neuromas between the second and third interdigital spaces states that is feeling better.  Objective: She has palpable Mulder's click to the second interdigital space left foot.  Assessment: Resolving neuroma third interdigital space and second interspace left foot.  Plan: Second interdigital space was injected today with dehydrated alcohol 2 cc at 4% dehydrated alcohol after sterile Betadine skin prep.  Follow-up with her in

## 2018-02-04 ENCOUNTER — Encounter: Payer: Self-pay | Admitting: Podiatry

## 2018-02-04 ENCOUNTER — Ambulatory Visit (INDEPENDENT_AMBULATORY_CARE_PROVIDER_SITE_OTHER): Payer: Medicare Other | Admitting: Podiatry

## 2018-02-04 DIAGNOSIS — G5762 Lesion of plantar nerve, left lower limb: Secondary | ICD-10-CM

## 2018-02-04 NOTE — Progress Notes (Signed)
She presents today for follow-up of her neuroma states that is about 80% better really does not bother me too much other than with a couple pairs of shoes.  Objective: Vital signs are stable alert oriented x3.  There is no erythema edema cellulitis drainage or odor has some tenderness on palpation third interdigital space of the left foot.  Assessment: Resolving neuroma third interspace left.  Plan: After sterile Betadine skin prep I injected 2 cc 4% dehydrated alcohol with local anesthetic.  Tolerated seizure well without complications.  Follow-up with her in 3 to 4 weeks.

## 2018-02-21 IMAGING — MG MM DIGITAL SCREENING BILAT W/ TOMO W/ CAD
9 of 13 series · 9 of 29 positions shown · non-contrast
Comparison: Previous exam(s).

CLINICAL DATA: Screening.

EXAM:
2D DIGITAL SCREENING BILATERAL MAMMOGRAM WITH CAD AND ADJUNCT TOMO

[R MLO (1 of 2)]
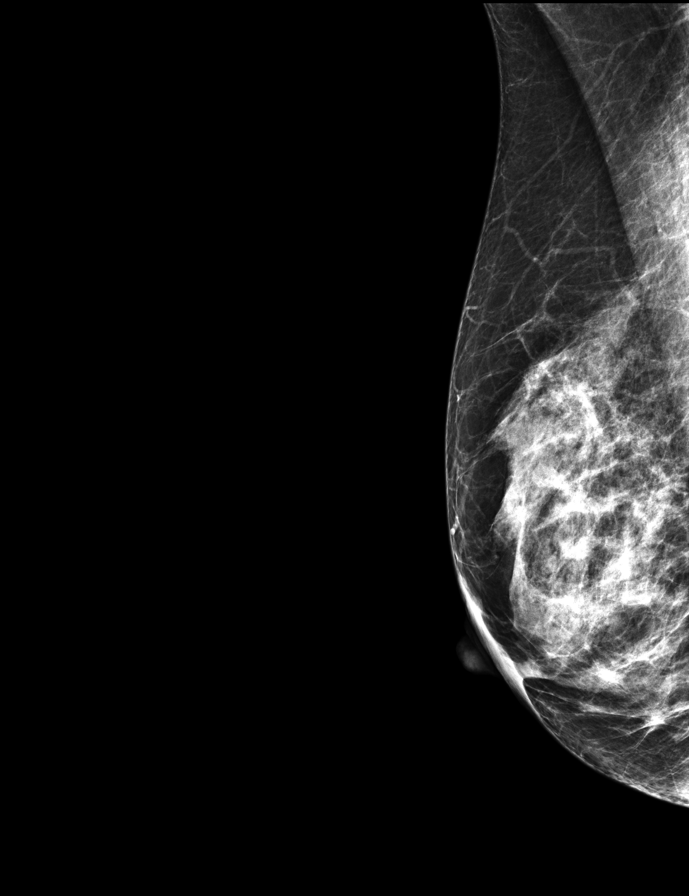

[R MLO (2 of 2)]
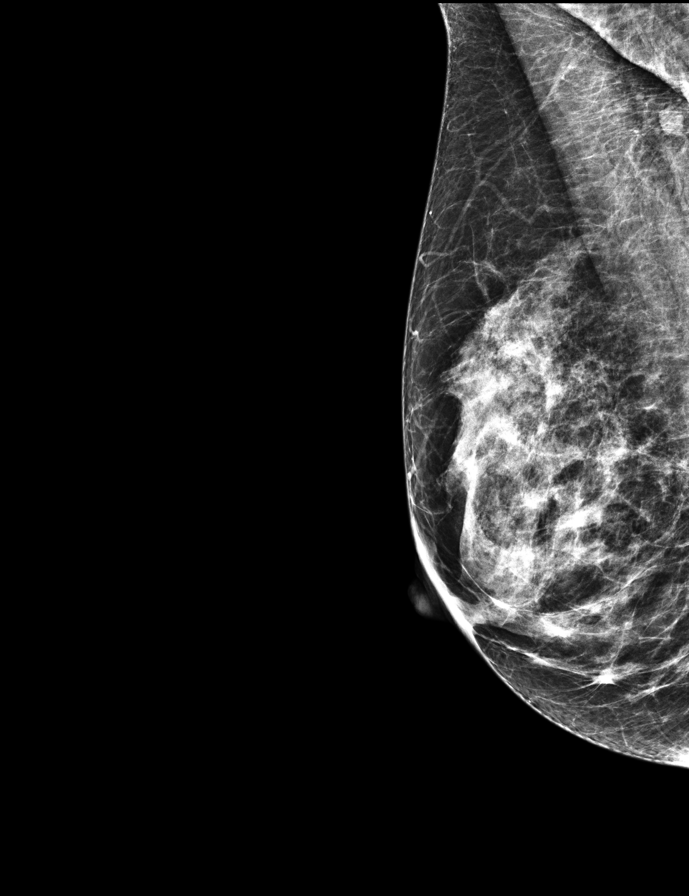

[L CC synth-2D]
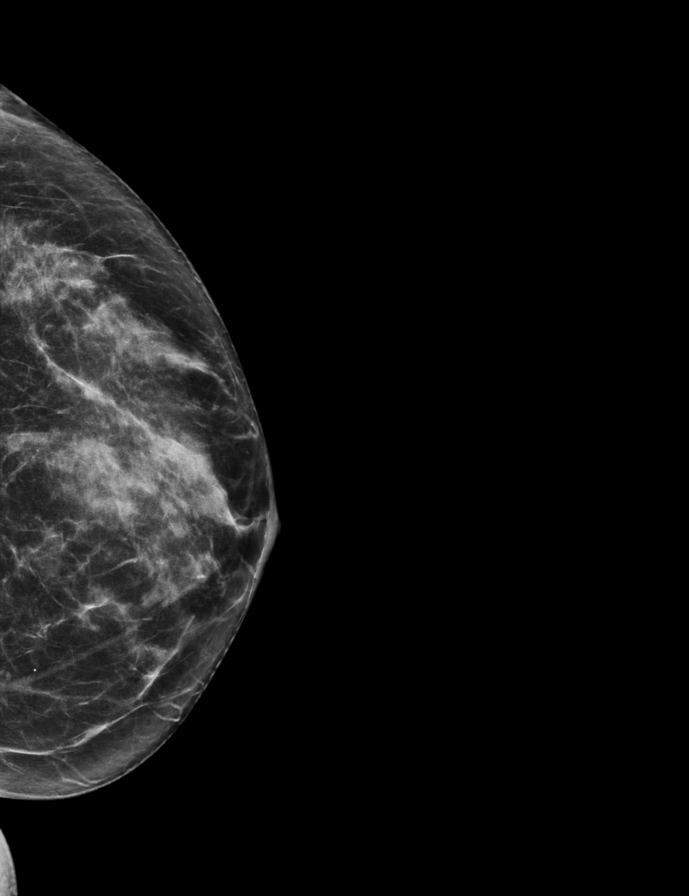

[L MLO]
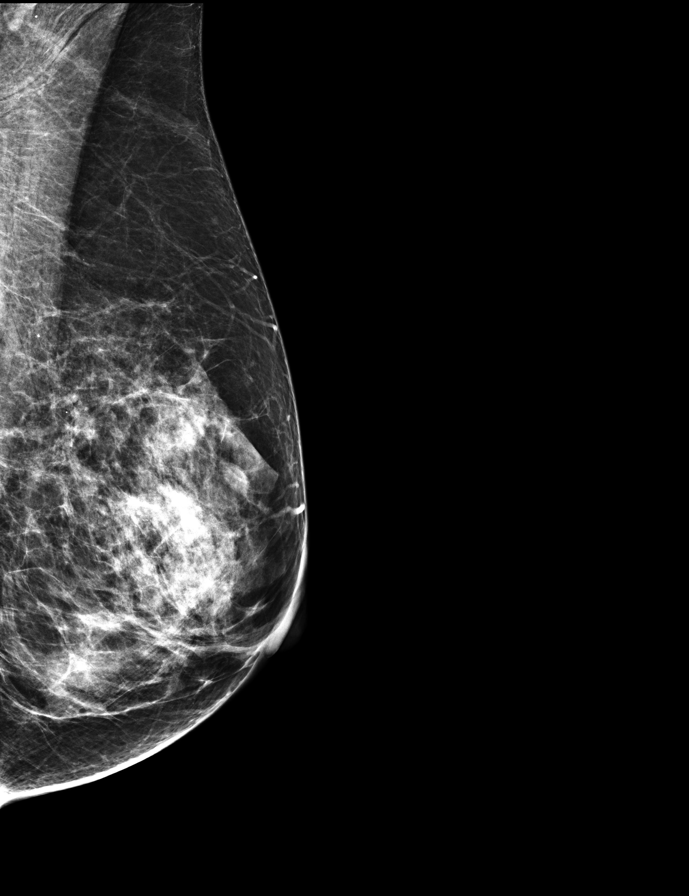

[L MLO synth-2D]
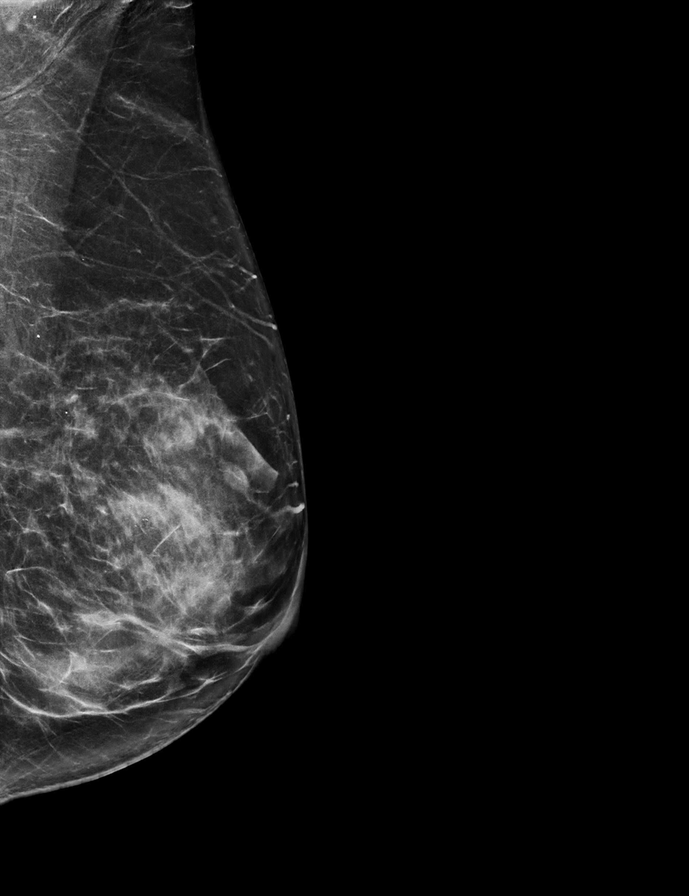

[R CC]
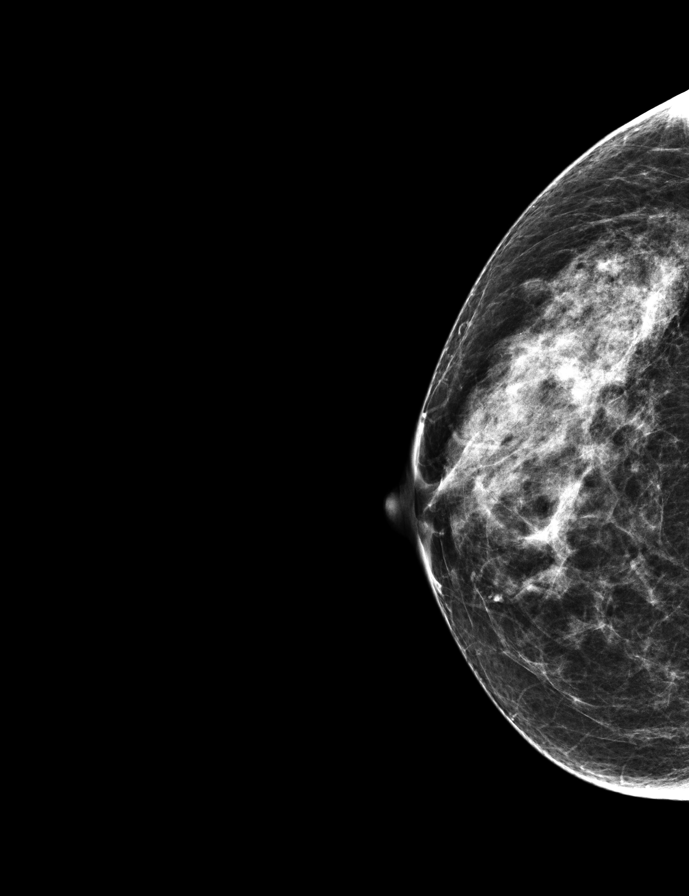

[L CC]
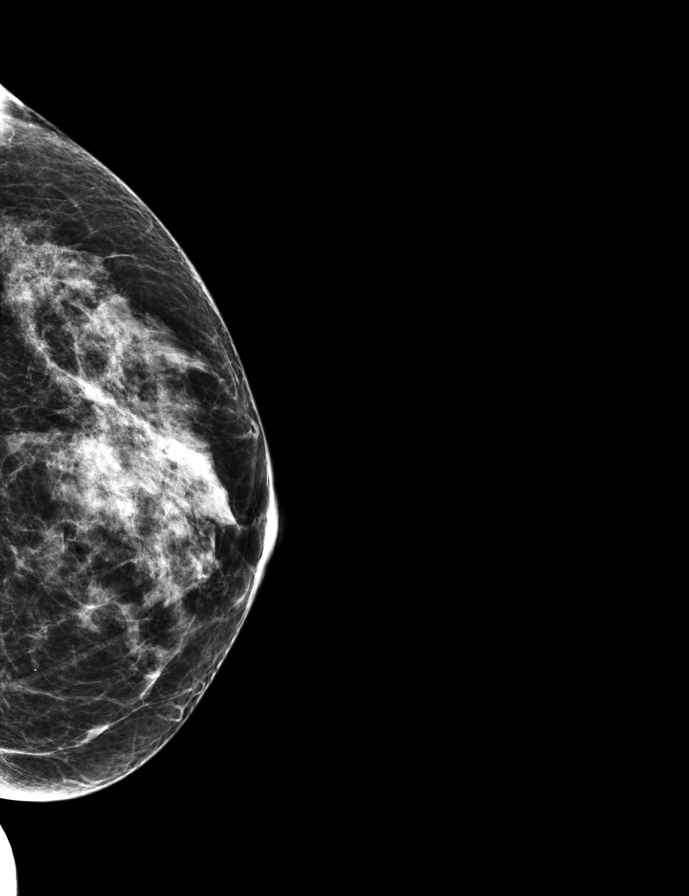

[R CC synth-2D]
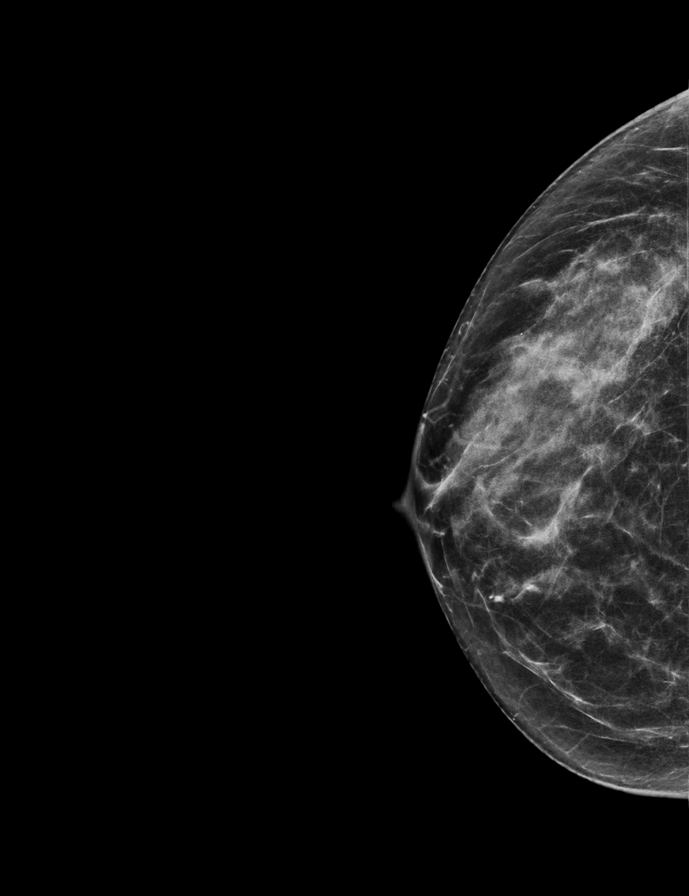

[R MLO synth-2D]
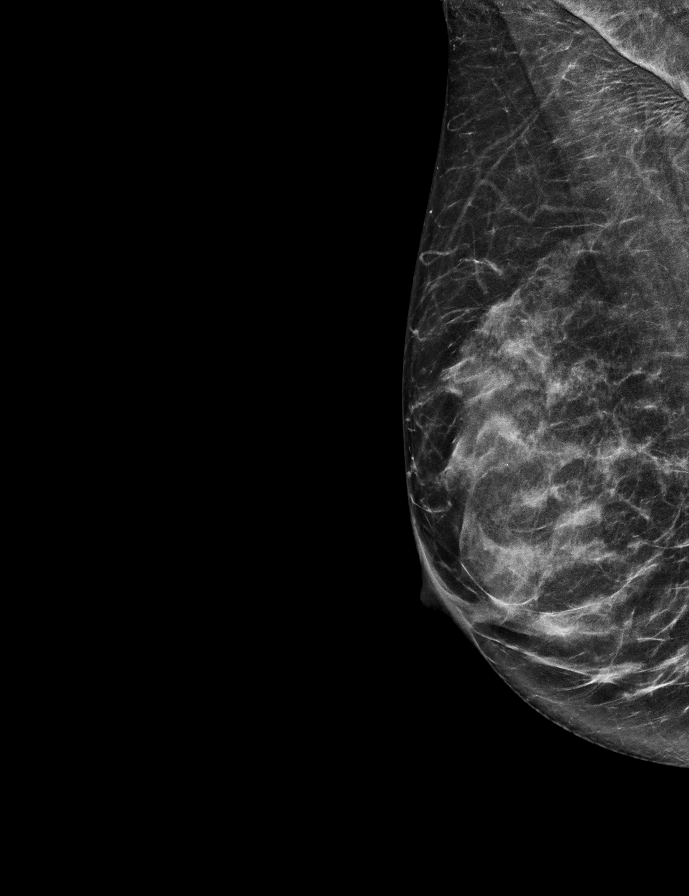

[9 of 29 positions shown; findings below may reference images not displayed]

ACR Breast Density Category c: The breast tissue is heterogeneously
dense, which may obscure small masses.
FINDINGS: There are no findings suspicious for malignancy. Images were
processed with CAD.
IMPRESSION: No mammographic evidence of malignancy. A result letter of this
screening mammogram will be mailed directly to the patient.

RECOMMENDATION:
Screening mammogram in one year. (Code:[id number])

BI-RADS CATEGORY  1: Negative.

## 2018-02-25 ENCOUNTER — Ambulatory Visit (INDEPENDENT_AMBULATORY_CARE_PROVIDER_SITE_OTHER): Payer: Medicare Other | Admitting: Podiatry

## 2018-02-25 ENCOUNTER — Encounter: Payer: Self-pay | Admitting: Podiatry

## 2018-02-25 DIAGNOSIS — G5762 Lesion of plantar nerve, left lower limb: Secondary | ICD-10-CM

## 2018-02-25 NOTE — Progress Notes (Signed)
She presents today for follow-up of neuroma to the left foot state that I would say I am about 95% better.  Objective: Vital signs are stable alert and oriented x3.  Pulses are palpable.  She has some tenderness on palpation third interspace of the left foot but less of a soft Mulder's click.  Assessment: Neuroma resolving 95% third interspace left foot.  Plan: I injected the area today with 2 cc of 4% dehydrated alcohol and local anesthetic point maximal tenderness.  Tolerated seizure well without complications.  Follow-up with her in 1 month if necessary.  Remember to ask at her trip to [place] went.  She will be going in April.

## 2018-03-17 ENCOUNTER — Encounter: Payer: Self-pay | Admitting: Podiatry

## 2018-03-17 ENCOUNTER — Ambulatory Visit (INDEPENDENT_AMBULATORY_CARE_PROVIDER_SITE_OTHER): Payer: Medicare Other | Admitting: Podiatry

## 2018-03-17 DIAGNOSIS — G5762 Lesion of plantar nerve, left lower limb: Secondary | ICD-10-CM | POA: Diagnosis not present

## 2018-03-18 NOTE — Progress Notes (Signed)
She presents today for follow-up of neuroma to the second interdigital space of the left foot.  She states that is really not doing too bad today but she would like to go ahead and put a shot in it just in case.  Objective: Vital signs are stable she alert and oriented x3.  Pulses are palpable.  Palpable Mulder's click third interspace of the left foot this time.  Assessment: Neuroma third interdigital space some residual to the second interdigital space.  Plan: At this point I injected 2 cc 4% dehydrated alcohol with local anesthetic into the fourth interdigital space of the left foot.  I will follow-up with her on a as needed basis

## 2018-06-29 ENCOUNTER — Ambulatory Visit (INDEPENDENT_AMBULATORY_CARE_PROVIDER_SITE_OTHER): Payer: Medicare Other | Admitting: Podiatry

## 2018-06-29 ENCOUNTER — Other Ambulatory Visit: Payer: Self-pay

## 2018-06-29 DIAGNOSIS — G5782 Other specified mononeuropathies of left lower limb: Secondary | ICD-10-CM

## 2018-06-29 DIAGNOSIS — G5762 Lesion of plantar nerve, left lower limb: Secondary | ICD-10-CM | POA: Diagnosis not present

## 2018-06-29 NOTE — Progress Notes (Signed)
She presents today chief complaint of pain to the third interdigital space of the left foot.  I think you need 1 more injection and has not really been hurting me too badly but is possible that I need 1 more injection.  Objective: Palpable neuroma third interdigital space.  Pulses are palpable.  Toes at the metatarsal phalangeal joints are all in good shape with good range of motion.  No pain there.  Assessment: Appears to be follow-up of neuroma second and third interdigital space.  Plan: Went ahead and reinjected the third interdigital space left.  With dehydrated alcohol

## 2018-07-27 ENCOUNTER — Encounter: Payer: Self-pay | Admitting: Podiatry

## 2018-07-27 ENCOUNTER — Ambulatory Visit (INDEPENDENT_AMBULATORY_CARE_PROVIDER_SITE_OTHER): Payer: Medicare Other | Admitting: Podiatry

## 2018-07-27 ENCOUNTER — Other Ambulatory Visit: Payer: Self-pay

## 2018-07-27 VITALS — Temp 97.7°F

## 2018-07-27 DIAGNOSIS — G5782 Other specified mononeuropathies of left lower limb: Secondary | ICD-10-CM

## 2018-07-27 DIAGNOSIS — G5762 Lesion of plantar nerve, left lower limb: Secondary | ICD-10-CM | POA: Diagnosis not present

## 2018-07-27 NOTE — Progress Notes (Signed)
She presents today for follow-up of her neuroma third interdigital space of the left foot states that is not great but is doing better than it was.  Objective: Vital signs are stable alert oriented x3.  Pulses are palpable.  There is no erythema edema cellulitis drainage or odor.  She has pain on palpation to the second and third interdigital spaces of the left foot.  Assessment: Pain in limb secondary to second and third interdigital space with a palpable Mulder's click.  Plan: After sterile Betadine skin prep I injected 1 cc of dehydrated alcohol into each individual interspace second and third left tolerated procedure well without complications follow-up with her in 1 month

## 2018-08-19 ENCOUNTER — Other Ambulatory Visit: Payer: Self-pay

## 2018-08-19 ENCOUNTER — Encounter: Payer: Self-pay | Admitting: Podiatry

## 2018-08-19 ENCOUNTER — Telehealth: Payer: Self-pay | Admitting: *Deleted

## 2018-08-19 ENCOUNTER — Ambulatory Visit (INDEPENDENT_AMBULATORY_CARE_PROVIDER_SITE_OTHER): Payer: Medicare Other | Admitting: Podiatry

## 2018-08-19 VITALS — Temp 98.5°F

## 2018-08-19 DIAGNOSIS — G5762 Lesion of plantar nerve, left lower limb: Secondary | ICD-10-CM | POA: Diagnosis not present

## 2018-08-19 DIAGNOSIS — G5782 Other specified mononeuropathies of left lower limb: Secondary | ICD-10-CM

## 2018-08-19 NOTE — Telephone Encounter (Signed)
"  Please give me a call.  I need to schedule some surgery."

## 2018-08-19 NOTE — Progress Notes (Signed)
She presents today for follow-up of neuroma left foot.  States that is a little better but it still hurts on like to have it removed if at all possible.  Review of systems she denies fever chills nausea vomiting muscle aches pains calf pain back pain chest pain shortness of breath.  She denies any changes in her past medical history medications allergies surgeries and social history.  Objective: Also stable alert oriented x3.  Palpable Mulder's click third interspace of the left foot is present.  She has full function of the toes good plantar flexion dorsiflexion inversion eversion all intrinsic muscular.  Appear to be intact.  Deep tendon reflexes are intact pulses are strongly palpable.  No open lesions or wounds.  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  Plan: At this point were going to go ahead and consider surgical excision neuroma third interdigital space left foot.  Consented her for this today we discussed the possible postop complications which may include but not limited to postop pain bleeding swelling infection recurrence need for further surgery overcorrection under correction chronic numbness.  She understands and is amenable to it we will follow-up with me in the near future for surgical intervention.

## 2018-08-21 NOTE — Telephone Encounter (Signed)
I am returning your call.  You want to schedule your surgery?  "Yes, I do but I was exposed to the Corona Virus since I left you all's office.  So, can we do it maybe on September 18?"  Yes, September 25, 2018 is available.  I will get it scheduled.  Someone from the surgical center will call you a day or two prior to your surgery date and will give you your arrival time.  You need to register with the surgical center via their online portal, the instructions are in the brochure that we gave you.

## 2018-09-02 ENCOUNTER — Other Ambulatory Visit: Payer: Self-pay | Admitting: Internal Medicine

## 2018-09-02 DIAGNOSIS — Z1231 Encounter for screening mammogram for malignant neoplasm of breast: Secondary | ICD-10-CM

## 2018-09-18 ENCOUNTER — Telehealth: Payer: Self-pay | Admitting: *Deleted

## 2018-09-18 NOTE — Telephone Encounter (Signed)
DOS 09/25/2018 NEURECTOMY 3RD INTERDIGITAL SPACE LT - 28080  BCBS: Eligibility Date - 01/07/2018 - 01/06/9998   In-Network    Max Per Benefit Period Year-to-Date Remaining  CoInsurance  30%   Deductible  $1500.00 $1438.46  Out-Of-Pocket  $5900.00 [id number]   AMBULATORY SURGERY  In Network  [employer] Coinsurance  Not Applicable  [id number] per [place]

## 2018-09-24 ENCOUNTER — Other Ambulatory Visit: Payer: Self-pay | Admitting: Podiatry

## 2018-09-24 MED ORDER — OXYCODONE-ACETAMINOPHEN 10-325 MG PO TABS: 1.0000 | ORAL_TABLET | Freq: Three times a day (TID) | ORAL | 0 refills | Status: AC | PRN

## 2018-09-24 MED ORDER — ONDANSETRON HCL 4 MG PO TABS: 4.0000 mg | ORAL_TABLET | Freq: Three times a day (TID) | ORAL | 0 refills | Status: DC | PRN

## 2018-09-24 MED ORDER — CEPHALEXIN 500 MG PO CAPS: 500.0000 mg | ORAL_CAPSULE | Freq: Three times a day (TID) | ORAL | 0 refills | Status: DC

## 2018-09-25 ENCOUNTER — Encounter: Payer: Self-pay | Admitting: Podiatry

## 2018-09-25 DIAGNOSIS — G5762 Lesion of plantar nerve, left lower limb: Secondary | ICD-10-CM

## 2018-09-28 ENCOUNTER — Telehealth: Payer: Self-pay | Admitting: *Deleted

## 2018-09-28 MED ORDER — TRAMADOL HCL 50 MG PO TABS: 50.0000 mg | ORAL_TABLET | Freq: Three times a day (TID) | ORAL | 0 refills | Status: AC | PRN

## 2018-09-28 NOTE — Telephone Encounter (Signed)
Dr. Cannon Kettle presented to the my office and I asked if I could send in an order for Tramadol 50 mg #30 one tablet every 8 hours prn foot pain. Dr. Marcene Duos.

## 2018-09-28 NOTE — Telephone Encounter (Signed)
I informed pt of Dr. Stover's orders. 

## 2018-09-28 NOTE — Telephone Encounter (Signed)
Pt states she had surgery on Friday with Dr. Milinda Pointer and got a prescription for hydrocodone and she can not take, and had listed that as an allergy. I reviewed pt's Allergies in [facility] and Hydrocodone was not listed, so I listed in Epic with the side effects pt stated she had when she took. Pt states she can take tramadol, and has been taking tylenol for pain.

## 2018-09-28 NOTE — Telephone Encounter (Signed)
Left message with tramadol orders on [id number].

## 2018-09-29 ENCOUNTER — Encounter: Payer: Self-pay | Admitting: Podiatry

## 2018-09-30 ENCOUNTER — Encounter: Payer: Self-pay | Admitting: Podiatry

## 2018-09-30 ENCOUNTER — Ambulatory Visit (INDEPENDENT_AMBULATORY_CARE_PROVIDER_SITE_OTHER): Payer: Self-pay | Admitting: Podiatry

## 2018-09-30 ENCOUNTER — Other Ambulatory Visit: Payer: Self-pay

## 2018-09-30 VITALS — BP 118/74 | HR 72 | Temp 98.6°F

## 2018-09-30 DIAGNOSIS — G5782 Other specified mononeuropathies of left lower limb: Secondary | ICD-10-CM

## 2018-09-30 DIAGNOSIS — G5762 Lesion of plantar nerve, left lower limb: Secondary | ICD-10-CM

## 2018-10-01 ENCOUNTER — Encounter: Payer: Self-pay | Admitting: Podiatry

## 2018-10-01 NOTE — Progress Notes (Signed)
She presents today for her first postop visit she is status post neurectomy third interdigital space left foot.  States that is doing good.  States that the pain was only bad the first night or so.  Objective: Vital signs are stable alert and oriented x3.  Pulses are palpable.  Dry sterile dressing intact was removed demonstrates no erythema edema cellulitis drainage or odor sutures are intact margins well coapted toes are rectus.  Assessment: Well-healing surgical foot.  Plan: Redressed today dressed a compressive dressing placed her back in her Darco shoe follow-up with her in 1 week.

## 2018-10-07 ENCOUNTER — Other Ambulatory Visit: Payer: Self-pay

## 2018-10-07 ENCOUNTER — Encounter: Payer: Self-pay | Admitting: Podiatry

## 2018-10-07 ENCOUNTER — Ambulatory Visit (INDEPENDENT_AMBULATORY_CARE_PROVIDER_SITE_OTHER): Payer: Medicare Other | Admitting: Podiatry

## 2018-10-07 DIAGNOSIS — G5782 Other specified mononeuropathies of left lower limb: Secondary | ICD-10-CM

## 2018-10-07 DIAGNOSIS — G5762 Lesion of plantar nerve, left lower limb: Secondary | ICD-10-CM

## 2018-10-07 DIAGNOSIS — Z9889 Other specified postprocedural states: Secondary | ICD-10-CM

## 2018-10-07 NOTE — Progress Notes (Signed)
She presents today date of surgery 09/25/2018 neurectomy third interdigital space left foot.  States that is doing a lot better but I still have soreness in the ball of the foot.  She denies fever chills nausea vomiting muscle aches pains.  Objective: Vital signs are stable alert and oriented x3.  Pulses are palpable.  Incision site appears to be healing very nicely there is no signs of infection.  There does not appear that there is complete coaptation as yet.  Assessment: Well-healing surgical foot.  Plan: I will await 1 more week before we remove the sutures.  I will allow her to start washing this but I will follow-up with her in 1 week for suture removal.

## 2018-10-12 ENCOUNTER — Ambulatory Visit
Admission: RE | Admit: 2018-10-12 | Discharge: 2018-10-12 | Disposition: A | Discharge status: Home / Self Care | Payer: Medicare Other | Source: Ambulatory Visit | Attending: Internal Medicine | Admitting: Internal Medicine

## 2018-10-12 DIAGNOSIS — Z1231 Encounter for screening mammogram for malignant neoplasm of breast: Secondary | ICD-10-CM | POA: Diagnosis present

## 2018-10-14 ENCOUNTER — Encounter: Payer: Self-pay | Admitting: Podiatry

## 2018-10-14 ENCOUNTER — Other Ambulatory Visit: Payer: Self-pay

## 2018-10-14 ENCOUNTER — Ambulatory Visit (INDEPENDENT_AMBULATORY_CARE_PROVIDER_SITE_OTHER): Payer: Medicare Other | Admitting: Podiatry

## 2018-10-14 DIAGNOSIS — Z9889 Other specified postprocedural states: Secondary | ICD-10-CM

## 2018-10-14 NOTE — Progress Notes (Signed)
Margaret Warner presents today date of surgery 09/26/2018 neurectomy third interspace left foot.  States that is doing great.  Objective: There is no erythema edema cellulitis drainage odor sutures are intact margins are well coapted sutures were removed today margins remain well coapted no signs of infection and no pain.  Assessment: Well-healing surgical foot left.  Plan: Follow-up with me on an as-needed basis.

## 2018-10-21 ENCOUNTER — Other Ambulatory Visit: Payer: Medicare Other

## 2018-10-28 ENCOUNTER — Other Ambulatory Visit: Payer: Self-pay

## 2018-10-28 ENCOUNTER — Other Ambulatory Visit: Payer: Self-pay | Admitting: Orthopedic Surgery

## 2018-10-28 ENCOUNTER — Encounter
Admission: RE | Admit: 2018-10-28 | Discharge: 2018-10-28 | Disposition: A | Discharge status: Home / Self Care | Payer: Medicare Other | Source: Ambulatory Visit | Attending: Orthopedic Surgery | Admitting: Orthopedic Surgery

## 2018-10-28 DIAGNOSIS — Z01818 Encounter for other preprocedural examination: Secondary | ICD-10-CM | POA: Insufficient documentation

## 2018-10-28 DIAGNOSIS — I10 Essential (primary) hypertension: Secondary | ICD-10-CM | POA: Diagnosis not present

## 2018-10-28 LAB — CBC WITH DIFFERENTIAL/PLATELET
Abs Immature Granulocytes: 0.03 10*3/uL (ref 0.00–0.07)
Basophils Absolute: 0.1 10*3/uL (ref 0.0–0.1)
Basophils Relative: 1 %
Eosinophils Absolute: 0 10*3/uL (ref 0.0–0.5)
Eosinophils Relative: 1 %
HCT: 40.2 % (ref 36.0–46.0)
Hemoglobin: 13.2 g/dL (ref 12.0–15.0)
Immature Granulocytes: 1 %
Lymphocytes Relative: 26 %
Lymphs Abs: 1.6 10*3/uL (ref 0.7–4.0)
MCH: 31.4 pg (ref 26.0–34.0)
MCHC: 32.8 g/dL (ref 30.0–36.0)
MCV: 95.7 fL (ref 80.0–100.0)
Monocytes Absolute: 0.4 10*3/uL (ref 0.1–1.0)
Monocytes Relative: 7 %
Neutro Abs: 3.8 10*3/uL (ref 1.7–7.7)
Neutrophils Relative %: 64 %
Platelets: 316 10*3/uL (ref 150–400)
RBC: 4.2 MIL/uL (ref 3.87–5.11)
RDW: 13.2 % (ref 11.5–15.5)
WBC: 5.9 10*3/uL (ref 4.0–10.5)
nRBC: 0 % (ref 0.0–0.2)

## 2018-10-28 LAB — BASIC METABOLIC PANEL
Anion gap: 8 (ref 5–15)
BUN: 12 mg/dL (ref 8–23)
CO2: 27 mmol/L (ref 22–32)
Calcium: 9.3 mg/dL (ref 8.9–10.3)
Chloride: 103 mmol/L (ref 98–111)
Creatinine, Ser: 0.64 mg/dL (ref 0.44–1.00)
GFR calc Af Amer: >60 mL/min (ref >60)
GFR calc non Af Amer: >60 mL/min (ref >60)
Glucose, Bld: 87 mg/dL (ref 70–99)
Potassium: 4 mmol/L (ref 3.5–5.1)
Sodium: 138 mmol/L (ref 135–145)

## 2018-10-28 LAB — PROTIME-INR
INR: 1 (ref 0.8–1.2)
Prothrombin Time: 12.9 seconds (ref 11.4–15.2)

## 2018-10-28 LAB — APTT: aPTT: 31 seconds (ref 24–36)

## 2018-10-28 NOTE — Patient Instructions (Signed)
Your procedure is scheduled on: November 03, 2018 TUESDAY Report to Day Surgery on the 2nd floor of the [facility]. To find out your arrival time, please call [phone] between 1PM - 3PM on: Monday November 02, 2018  REMEMBER: Instructions that are not followed completely may result in serious medical risk, up to and including death; or upon the discretion of your surgeon and anesthesiologist your surgery may need to be rescheduled.  Do not eat food after midnight the night before surgery.  No gum chewing, lozengers or hard candies.  You may however, drink CLEAR liquids up to 2 hours before you are scheduled to arrive for your surgery. Do not drink anything within 2 hours of the start of your surgery.  Clear liquids include: - water  - apple juice without pulp - CLEAR  gatorade - black coffee or tea (Do NOT add milk or creamers to the coffee or tea) Do NOT drink anything that is not on this list.  Type 1 and Type 2 diabetics should only drink water.   No Alcohol for 24 hours before or after surgery.  No Smoking including e-cigarettes for 24 hours prior to surgery.  No chewable tobacco products for at least 6 hours prior to surgery.  No nicotine patches on the day of surgery.  On the morning of surgery brush your teeth with toothpaste and water, you may rinse your mouth with mouthwash if you wish. Do not swallow any toothpaste or mouthwash.  Notify your doctor if there is any change in your medical condition (cold, fever, infection).  Do not wear jewelry, make-up, hairpins, clips or nail polish.  Do not wear lotions, powders, or perfumes.   Do not shave 48 hours prior to surgery.   Contacts and dentures may not be worn into surgery.  Do not bring valuables to the hospital, including drivers license, insurance or credit cards.  [facility] is not responsible for any belongings or valuables.   TAKE THESE MEDICATIONS THE MORNING OF  SURGERY: BUPROPION CYMBALTA GABAPENTIN VALTREX  NO COMBIPATCH AND IRBESARTAN-HYDROCHLOROTHIAZIDE DAY OF SURGERY  USE FLONASE DAY OF SURGERY  Use CHG Soap  as directed on instruction sheet.  Follow recommendations from Cardiologist, Pulmonologist or PCP regarding stopping Aspirin, Coumadin, Plavix, Eliquis, Pradaxa, or Pletal.  Stop Anti-inflammatories (NSAIDS) such as Advil, Aleve, Ibuprofen, Motrin, Naproxen, Naprosyn and Aspirin based products such as Excedrin, Goodys Powder, BC Powder. (May take Tylenol or Acetaminophen if needed.)  Stop ANY OVER THE COUNTER supplements until after surgery. (May continue Vitamin D, Vitamin B, and multivitamin.)  Wear comfortable clothing (specific to your surgery type) to the hospital.  Plan for stool softeners for home use.  If you are being discharged the day of surgery, you will not be allowed to drive home. You will need a responsible adult to drive you home and stay with you that night.   If you are taking public transportation, you will need to have a responsible adult with you. Please confirm with your physician that it is acceptable to use public transportation.   Please call [phone] if you have any questions about these instructions.

## 2018-10-30 ENCOUNTER — Other Ambulatory Visit
Admission: RE | Admit: 2018-10-30 | Discharge: 2018-10-30 | Disposition: A | Discharge status: Home / Self Care | Payer: Medicare Other | Source: Ambulatory Visit | Attending: Orthopedic Surgery | Admitting: Orthopedic Surgery

## 2018-10-30 ENCOUNTER — Other Ambulatory Visit: Payer: Self-pay

## 2018-10-30 DIAGNOSIS — Z01812 Encounter for preprocedural laboratory examination: Secondary | ICD-10-CM | POA: Insufficient documentation

## 2018-10-30 DIAGNOSIS — Z20828 Contact with and (suspected) exposure to other viral communicable diseases: Secondary | ICD-10-CM | POA: Diagnosis not present

## 2018-10-30 LAB — SARS CORONAVIRUS 2 (TAT 6-24 HRS): SARS Coronavirus 2: NEGATIVE

## 2018-11-03 ENCOUNTER — Ambulatory Visit: Payer: Medicare Other | Admitting: Anesthesiology

## 2018-11-03 ENCOUNTER — Ambulatory Visit
Admission: RE | Admit: 2018-11-03 | Discharge: 2018-11-03 | Disposition: A | Discharge status: Home / Self Care | Payer: Medicare Other | Attending: Orthopedic Surgery | Admitting: Orthopedic Surgery

## 2018-11-03 ENCOUNTER — Ambulatory Visit: Payer: Medicare Other

## 2018-11-03 ENCOUNTER — Encounter
Admission: RE | Disposition: A | Discharge status: Home / Self Care | Payer: Self-pay | Source: Home / Self Care | Attending: Orthopedic Surgery

## 2018-11-03 ENCOUNTER — Encounter: Payer: Self-pay | Admitting: *Deleted

## 2018-11-03 ENCOUNTER — Other Ambulatory Visit: Payer: Self-pay

## 2018-11-03 DIAGNOSIS — M19012 Primary osteoarthritis, left shoulder: Secondary | ICD-10-CM | POA: Diagnosis not present

## 2018-11-03 DIAGNOSIS — Z79891 Long term (current) use of opiate analgesic: Secondary | ICD-10-CM | POA: Insufficient documentation

## 2018-11-03 DIAGNOSIS — I10 Essential (primary) hypertension: Secondary | ICD-10-CM | POA: Insufficient documentation

## 2018-11-03 DIAGNOSIS — Z79899 Other long term (current) drug therapy: Secondary | ICD-10-CM | POA: Insufficient documentation

## 2018-11-03 DIAGNOSIS — S43432A Superior glenoid labrum lesion of left shoulder, initial encounter: Secondary | ICD-10-CM | POA: Insufficient documentation

## 2018-11-03 DIAGNOSIS — F329 Major depressive disorder, single episode, unspecified: Secondary | ICD-10-CM | POA: Insufficient documentation

## 2018-11-03 DIAGNOSIS — Z87891 Personal history of nicotine dependence: Secondary | ICD-10-CM | POA: Diagnosis not present

## 2018-11-03 DIAGNOSIS — F419 Anxiety disorder, unspecified: Secondary | ICD-10-CM | POA: Insufficient documentation

## 2018-11-03 DIAGNOSIS — X58XXXA Exposure to other specified factors, initial encounter: Secondary | ICD-10-CM | POA: Diagnosis not present

## 2018-11-03 HISTORY — PX: SHOULDER ARTHROSCOPY WITH OPEN ROTATOR CUFF REPAIR: SHX6092

## 2018-11-03 LAB — URINE DRUG SCREEN, QUALITATIVE (ARMC ONLY)
Amphetamines, Ur Screen: NOT DETECTED
Barbiturates, Ur Screen: POSITIVE — AB
Benzodiazepine, Ur Scrn: POSITIVE — AB
Cannabinoid 50 Ng, Ur ~~LOC~~: NOT DETECTED
Cocaine Metabolite,Ur ~~LOC~~: NOT DETECTED
MDMA (Ecstasy)Ur Screen: NOT DETECTED
Methadone Scn, Ur: NOT DETECTED
Opiate, Ur Screen: NOT DETECTED
Phencyclidine (PCP) Ur S: NOT DETECTED
Tricyclic, Ur Screen: NOT DETECTED

## 2018-11-03 SURGERY — ARTHROSCOPY, SHOULDER WITH REPAIR, ROTATOR CUFF, OPEN
Anesthesia: General | Site: Shoulder | Laterality: Left

## 2018-11-03 MED ORDER — LIDOCAINE HCL (PF) 1 % IJ SOLN
INTRAMUSCULAR | Status: DC | PRN
  Administered 2018-11-03: .8 mL via SUBCUTANEOUS

## 2018-11-03 MED ORDER — FENTANYL CITRATE (PF) 100 MCG/2ML IJ SOLN: 50.0000 ug | INTRAMUSCULAR | Status: DC | PRN

## 2018-11-03 MED ORDER — LIDOCAINE HCL 4 % MT SOLN
OROMUCOSAL | Status: DC | PRN
  Administered 2018-11-03: 4 mL via TOPICAL

## 2018-11-03 MED ORDER — ROCURONIUM BROMIDE 100 MG/10ML IV SOLN
INTRAVENOUS | Status: DC | PRN
  Administered 2018-11-03: 30 mg via INTRAVENOUS

## 2018-11-03 MED ORDER — EPINEPHRINE PF 1 MG/ML IJ SOLN
INTRAMUSCULAR | Status: AC
  Filled 2018-11-03: qty 4

## 2018-11-03 MED ORDER — CEFAZOLIN SODIUM-DEXTROSE 2-4 GM/100ML-% IV SOLN
2.0000 g | INTRAVENOUS | Status: AC
  Administered 2018-11-03: 2 g via INTRAVENOUS

## 2018-11-03 MED ORDER — GLYCOPYRROLATE 0.2 MG/ML IJ SOLN
INTRAMUSCULAR | Status: DC | PRN
  Administered 2018-11-03: 0.2 mg via INTRAVENOUS

## 2018-11-03 MED ORDER — ONDANSETRON HCL 4 MG/2ML IJ SOLN: 4.0000 mg | Freq: Once | INTRAMUSCULAR | Status: DC | PRN

## 2018-11-03 MED ORDER — FENTANYL CITRATE (PF) 100 MCG/2ML IJ SOLN
50.0000 ug | Freq: Once | INTRAMUSCULAR | Status: AC
  Administered 2018-11-03: 07:00:00 50 ug via INTRAVENOUS

## 2018-11-03 MED ORDER — FENTANYL CITRATE (PF) 250 MCG/5ML IJ SOLN
INTRAMUSCULAR | Status: DC | PRN
  Administered 2018-11-03 (×2): 50 ug via INTRAVENOUS

## 2018-11-03 MED ORDER — ACETAMINOPHEN 10 MG/ML IV SOLN
INTRAVENOUS | Status: AC
  Filled 2018-11-03: qty 100

## 2018-11-03 MED ORDER — ROCURONIUM BROMIDE 50 MG/5ML IV SOLN
INTRAVENOUS | Status: AC
  Filled 2018-11-03: qty 1

## 2018-11-03 MED ORDER — BUPIVACAINE HCL (PF) 0.5 % IJ SOLN
INTRAMUSCULAR | Status: DC | PRN
  Administered 2018-11-03: 10 mL

## 2018-11-03 MED ORDER — EPHEDRINE SULFATE 50 MG/ML IJ SOLN
INTRAMUSCULAR | Status: DC | PRN
  Administered 2018-11-03: 5 mg via INTRAVENOUS
  Administered 2018-11-03: 15 mg via INTRAVENOUS
  Administered 2018-11-03 (×3): 10 mg via INTRAVENOUS

## 2018-11-03 MED ORDER — PHENYLEPHRINE HCL (PRESSORS) 10 MG/ML IV SOLN
INTRAVENOUS | Status: AC
  Filled 2018-11-03: qty 1

## 2018-11-03 MED ORDER — CHLORHEXIDINE GLUCONATE CLOTH 2 % EX PADS: 6.0000 | MEDICATED_PAD | Freq: Once | CUTANEOUS | Status: DC

## 2018-11-03 MED ORDER — NEOMYCIN-POLYMYXIN B GU 40-200000 IR SOLN
Status: AC
  Filled 2018-11-03: qty 2

## 2018-11-03 MED ORDER — MIDAZOLAM HCL 2 MG/2ML IJ SOLN
INTRAMUSCULAR | Status: AC
  Administered 2018-11-03: 1 mg via INTRAVENOUS
  Filled 2018-11-03: qty 2

## 2018-11-03 MED ORDER — GLYCOPYRROLATE 0.2 MG/ML IJ SOLN
INTRAMUSCULAR | Status: AC
  Filled 2018-11-03: qty 1

## 2018-11-03 MED ORDER — LIDOCAINE HCL (PF) 2 % IJ SOLN
INTRAMUSCULAR | Status: AC
  Filled 2018-11-03: qty 10

## 2018-11-03 MED ORDER — LACTATED RINGERS IV SOLN
INTRAVENOUS | Status: DC | PRN
  Administered 2018-11-03: 08:00:00 4 mL

## 2018-11-03 MED ORDER — SUGAMMADEX SODIUM 200 MG/2ML IV SOLN
INTRAVENOUS | Status: DC | PRN
  Administered 2018-11-03: 100 mg via INTRAVENOUS

## 2018-11-03 MED ORDER — ONDANSETRON HCL 4 MG PO TABS: 4.0000 mg | ORAL_TABLET | Freq: Three times a day (TID) | ORAL | 0 refills | Status: DC | PRN

## 2018-11-03 MED ORDER — LACTATED RINGERS IV SOLN
INTRAVENOUS | Status: DC
  Administered 2018-11-03: 07:00:00 via INTRAVENOUS

## 2018-11-03 MED ORDER — SUGAMMADEX SODIUM 200 MG/2ML IV SOLN
INTRAVENOUS | Status: AC
  Filled 2018-11-03: qty 2

## 2018-11-03 MED ORDER — LIDOCAINE HCL (PF) 1 % IJ SOLN
INTRAMUSCULAR | Status: AC
  Filled 2018-11-03: qty 30

## 2018-11-03 MED ORDER — FENTANYL CITRATE (PF) 100 MCG/2ML IJ SOLN: 25.0000 ug | INTRAMUSCULAR | Status: DC | PRN

## 2018-11-03 MED ORDER — MIDAZOLAM HCL 2 MG/2ML IJ SOLN
INTRAMUSCULAR | Status: AC
  Filled 2018-11-03: qty 2

## 2018-11-03 MED ORDER — CEFAZOLIN SODIUM-DEXTROSE 2-4 GM/100ML-% IV SOLN
INTRAVENOUS | Status: AC
  Filled 2018-11-03: qty 100

## 2018-11-03 MED ORDER — ACETAMINOPHEN 10 MG/ML IV SOLN
INTRAVENOUS | Status: DC | PRN
  Administered 2018-11-03: 1000 mg via INTRAVENOUS

## 2018-11-03 MED ORDER — LIDOCAINE HCL (PF) 1 % IJ SOLN
INTRAMUSCULAR | Status: AC
  Filled 2018-11-03: qty 5

## 2018-11-03 MED ORDER — OXYCODONE HCL 5 MG PO TABS: 5.0000 mg | ORAL_TABLET | ORAL | 0 refills | Status: DC | PRN

## 2018-11-03 MED ORDER — MIDAZOLAM HCL 2 MG/2ML IJ SOLN
INTRAMUSCULAR | Status: DC | PRN
  Administered 2018-11-03: 2 mg via INTRAVENOUS

## 2018-11-03 MED ORDER — FAMOTIDINE 20 MG PO TABS
20.0000 mg | ORAL_TABLET | Freq: Once | ORAL | Status: AC
  Administered 2018-11-03: 06:00:00 20 mg via ORAL

## 2018-11-03 MED ORDER — BUPIVACAINE HCL (PF) 0.5 % IJ SOLN
INTRAMUSCULAR | Status: AC
  Filled 2018-11-03: qty 10

## 2018-11-03 MED ORDER — BUPIVACAINE LIPOSOME 1.3 % IJ SUSP
INTRAMUSCULAR | Status: DC | PRN
  Administered 2018-11-03: 20 mL via PERINEURAL

## 2018-11-03 MED ORDER — MIDAZOLAM HCL 2 MG/2ML IJ SOLN: 0.5000 mg | INTRAMUSCULAR | Status: DC | PRN

## 2018-11-03 MED ORDER — FENTANYL CITRATE (PF) 100 MCG/2ML IJ SOLN
INTRAMUSCULAR | Status: AC
  Filled 2018-11-03: qty 2

## 2018-11-03 MED ORDER — PHENYLEPHRINE HCL (PRESSORS) 10 MG/ML IV SOLN
INTRAVENOUS | Status: DC | PRN
  Administered 2018-11-03 (×3): 100 ug via INTRAVENOUS

## 2018-11-03 MED ORDER — EPHEDRINE SULFATE 50 MG/ML IJ SOLN
INTRAMUSCULAR | Status: AC
  Filled 2018-11-03: qty 1

## 2018-11-03 MED ORDER — BUPIVACAINE HCL (PF) 0.25 % IJ SOLN
INTRAMUSCULAR | Status: AC
  Filled 2018-11-03: qty 30

## 2018-11-03 MED ORDER — PROPOFOL 10 MG/ML IV BOLUS
INTRAVENOUS | Status: AC
  Filled 2018-11-03: qty 20

## 2018-11-03 MED ORDER — DEXAMETHASONE SODIUM PHOSPHATE 10 MG/ML IJ SOLN
INTRAMUSCULAR | Status: DC | PRN
  Administered 2018-11-03: 6 mg via INTRAVENOUS

## 2018-11-03 MED ORDER — EPINEPHRINE PF 1 MG/ML IJ SOLN
INTRAMUSCULAR | Status: AC
  Filled 2018-11-03: qty 1

## 2018-11-03 MED ORDER — DEXAMETHASONE SODIUM PHOSPHATE 10 MG/ML IJ SOLN
INTRAMUSCULAR | Status: AC
  Filled 2018-11-03: qty 1

## 2018-11-03 MED ORDER — LIDOCAINE HCL (CARDIAC) PF 100 MG/5ML IV SOSY
PREFILLED_SYRINGE | INTRAVENOUS | Status: DC | PRN
  Administered 2018-11-03: 60 mg via INTRATRACHEAL

## 2018-11-03 MED ORDER — MIDAZOLAM HCL 2 MG/2ML IJ SOLN
1.0000 mg | Freq: Once | INTRAMUSCULAR | Status: AC
  Administered 2018-11-03: 07:00:00 1 mg via INTRAVENOUS

## 2018-11-03 MED ORDER — FAMOTIDINE 20 MG PO TABS
ORAL_TABLET | ORAL | Status: AC
  Administered 2018-11-03: 20 mg via ORAL
  Filled 2018-11-03: qty 1

## 2018-11-03 MED ORDER — ONDANSETRON HCL 4 MG/2ML IJ SOLN
INTRAMUSCULAR | Status: DC | PRN
  Administered 2018-11-03: 4 mg via INTRAVENOUS

## 2018-11-03 MED ORDER — PROPOFOL 10 MG/ML IV BOLUS
INTRAVENOUS | Status: DC | PRN
  Administered 2018-11-03: 120 mg via INTRAVENOUS

## 2018-11-03 MED ORDER — BUPIVACAINE LIPOSOME 1.3 % IJ SUSP
INTRAMUSCULAR | Status: AC
  Filled 2018-11-03: qty 20

## 2018-11-03 MED ORDER — FENTANYL CITRATE (PF) 100 MCG/2ML IJ SOLN
INTRAMUSCULAR | Status: AC
  Administered 2018-11-03: 50 ug via INTRAVENOUS
  Filled 2018-11-03: qty 2

## 2018-11-03 MED ORDER — ONDANSETRON HCL 4 MG/2ML IJ SOLN
INTRAMUSCULAR | Status: AC
  Filled 2018-11-03: qty 2

## 2018-11-03 SURGICAL SUPPLY — 70 items
ADAPTER IRRIG TUBE 2 SPIKE SOL (ADAPTER) ×6 IMPLANT
ANCHOR SUT BIOC ST 3X145 (Anchor) ×4 IMPLANT
BUR RADIUS 4.0X18.5 (BURR) ×3 IMPLANT
BUR RADIUS 5.5 (BURR) ×3 IMPLANT
CANNULA 5.75X7 CRYSTAL CLEAR (CANNULA) ×6 IMPLANT
CANNULA PARTIAL THREAD 2X7 (CANNULA) ×2 IMPLANT
CANNULA TWIST IN 8.25X9CM (CANNULA) IMPLANT
CLOSURE WOUND 1/2 X4 (GAUZE/BANDAGES/DRESSINGS) ×2
CONNECTOR PERFECT PASSER (CONNECTOR) IMPLANT
COOLER POLAR GLACIER W/PUMP (MISCELLANEOUS) ×3 IMPLANT
COVER WAND RF STERILE (DRAPES) ×3 IMPLANT
CRADLE LAMINECT ARM (MISCELLANEOUS) ×3 IMPLANT
DEVICE SUCT BLK HOLE OR FLOOR (MISCELLANEOUS) IMPLANT
DRAPE 3/4 80X56 (DRAPES) ×3 IMPLANT
DRAPE IMP U-DRAPE 54X76 (DRAPES) ×6 IMPLANT
DRAPE INCISE IOBAN 66X45 STRL (DRAPES) ×3 IMPLANT
DRAPE U-SHAPE 47X51 STRL (DRAPES) IMPLANT
DURAPREP 26ML APPLICATOR (WOUND CARE) ×9 IMPLANT
ELECT REM PT RETURN 9FT ADLT (ELECTROSURGICAL) ×3
ELECTRODE REM PT RTRN 9FT ADLT (ELECTROSURGICAL) ×1 IMPLANT
GAUZE SPONGE 4X4 12PLY STRL (GAUZE/BANDAGES/DRESSINGS) ×6 IMPLANT
GAUZE XEROFORM 1X8 LF (GAUZE/BANDAGES/DRESSINGS) ×3 IMPLANT
GLOVE BIOGEL PI IND STRL 9 (GLOVE) ×1 IMPLANT
GLOVE BIOGEL PI INDICATOR 9 (GLOVE) ×2
GLOVE SURG 9.0 ORTHO LTXF (GLOVE) ×6 IMPLANT
GOWN STRL REUS TWL 2XL XL LVL4 (GOWN DISPOSABLE) ×3 IMPLANT
GOWN STRL REUS W/ TWL LRG LVL3 (GOWN DISPOSABLE) ×1 IMPLANT
GOWN STRL REUS W/ TWL LRG LVL4 (GOWN DISPOSABLE) ×1 IMPLANT
GOWN STRL REUS W/TWL LRG LVL3 (GOWN DISPOSABLE) ×2
GOWN STRL REUS W/TWL LRG LVL4 (GOWN DISPOSABLE) ×2
IV LACTATED RINGER IRRG 3000ML (IV SOLUTION) ×36
IV LR IRRIG 3000ML ARTHROMATIC (IV SOLUTION) ×6 IMPLANT
KIT STABILIZATION SHOULDER (MISCELLANEOUS) ×3 IMPLANT
KIT SUTURE 2.8 Q-FIX DISP (MISCELLANEOUS) IMPLANT
KIT SUTURETAK 3.0 INSERT PERC (KITS) ×2 IMPLANT
KIT TURNOVER KIT A (KITS) ×3 IMPLANT
MANIFOLD NEPTUNE II (INSTRUMENTS) ×3 IMPLANT
MASK FACE SPIDER DISP (MASK) ×3 IMPLANT
MAT ABSORB  FLUID 56X50 GRAY (MISCELLANEOUS) ×4
MAT ABSORB FLUID 56X50 GRAY (MISCELLANEOUS) ×2 IMPLANT
NDL SAFETY ECLIPSE 18X1.5 (NEEDLE) ×1 IMPLANT
NEEDLE HYPO 18GX1.5 SHARP (NEEDLE) ×2
NEEDLE HYPO 22GX1.5 SAFETY (NEEDLE) ×3 IMPLANT
NS IRRIG 500ML POUR BTL (IV SOLUTION) ×3 IMPLANT
PACK ARTHROSCOPY SHOULDER (MISCELLANEOUS) ×3 IMPLANT
PAD WRAPON POLAR SHDR XLG (MISCELLANEOUS) ×1 IMPLANT
PASSER SUT CAPTURE FIRST (SUTURE) IMPLANT
SET TUBE SUCT SHAVER OUTFL 24K (TUBING) ×3 IMPLANT
SET TUBE TIP INTRA-ARTICULAR (MISCELLANEOUS) ×3 IMPLANT
STRAP SAFETY 5IN WIDE (MISCELLANEOUS) ×3 IMPLANT
STRIP CLOSURE SKIN 1/2X4 (GAUZE/BANDAGES/DRESSINGS) ×4 IMPLANT
SUT ETHILON 4-0 (SUTURE) ×2
SUT ETHILON 4-0 FS2 18XMFL BLK (SUTURE) ×1
SUT LASSO 90 DEG SD STR (SUTURE) ×2 IMPLANT
SUT MNCRL 4-0 (SUTURE) ×2
SUT MNCRL 4-0 27XMFL (SUTURE) ×1
SUT PDS AB 0 CT1 27 (SUTURE) ×3 IMPLANT
SUT PERFECTPASSER WHITE CART (SUTURE) IMPLANT
SUT VIC AB 0 CT1 36 (SUTURE) ×3 IMPLANT
SUT VIC AB 2-0 CT2 27 (SUTURE) ×3 IMPLANT
SUTURE ETHLN 4-0 FS2 18XMF BLK (SUTURE) ×1 IMPLANT
SUTURE MAGNUM WIRE 2X48 BLK (SUTURE) IMPLANT
SUTURE MNCRL 4-0 27XMF (SUTURE) ×1 IMPLANT
SYR 10ML LL (SYRINGE) ×3 IMPLANT
TAPE MICROFOAM 4IN (TAPE) ×3 IMPLANT
TUBING ARTHRO INFLOW-ONLY STRL (TUBING) ×3 IMPLANT
TUBING CONNECTING 10 (TUBING) ×2 IMPLANT
TUBING CONNECTING 10' (TUBING) ×1
WAND HAND CNTRL MULTIVAC 90 (MISCELLANEOUS) ×3 IMPLANT
WRAPON POLAR PAD SHDR XLG (MISCELLANEOUS) ×3

## 2018-11-03 NOTE — Progress Notes (Signed)
Ch visited pt while rounding in pre-op. Pt is a 68 y.o. female here for left shoulder repair. Ch provided a space for pt to share about how she has been struggling w/ feeling despondent at times during the pandemic. Pt expressed her concerns for a close friend and her grief related to the people and ways of being that have happened to her in the past and present. Ch provided words of comfort before the pt was assessed by the provider. No further needs at this time.    11/03/18 0700  Clinical Encounter Type  Visited With Patient;Health care provider  Visit Type Pre-op;Social support  Spiritual Encounters  Spiritual Needs Emotional;Grief support  Stress Factors  Patient Stress Factors Health changes;Major life changes

## 2018-11-03 NOTE — Discharge Instructions (Signed)

## 2018-11-03 NOTE — Op Note (Signed)
11/03/2018  10:34 AM  PATIENT:  Margaret Warner  68 y.o. 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 female with a diagnosis of S43.432A superior glenoid labrum lesion of left shoulder who failed conservative measures and elected for surgical management.    I discussed the risks and benefits of surgery. The risks include but are not limited to infection, bleeding, nerve or blood vessel injury, joint stiffness or loss of motion, persistent pain, weakness or instability, and hardware failure and the need for further surgery. Medical risks include but are not limited to DVT and pulmonary embolism, myocardial infarction, stroke, pneumonia, respiratory failure and death. Patient understood these risks and wished to proceed.   OPERATIVE IMPLANTS: Arthrex bio suturetak anchors  2   OPERATIVE FINDINGS:  Left shoulder superior labral tear   OPERATIVE PROCEDURE:  I met with the patient in the preoperative area.  A preop history and physical was performed.  I signed the left shoulder according the hospital's correct site of surgery protocol.  I answered all questions by the patient. Patient was brought to the operating room. She underwent general endotracheal intubation. The patient was then positioned in a beachchair position. All bony prominences were adequately padded including the lower extremities.  Examination  under anesthesia revealed no instability with load and shift testing, and a negative sulcus sign testing respectively.  The patient was then prepped and draped in a sterile fashion. The patient received 2 grams of ancef prior to the onset of the case.  A timeout was performed to verify the patient's name, date of birth, medical record number, correct site of surgery and correct procedure to be performed.The timeout was also used to verify the patient received antibiotics that all appropriate instruments, implants and radiographic studies were available in the room. Once all in attendance were in agreement case began.  Bony landmarks were drawn out with a surgical marker along with proposed incisions. These were pre-injected with 1% lidocaine plain. An 11 blade was used to establish a posterior portal through which the arthroscope was placed in the glenohumeral joint. An anterior portal was established under direct visualization using an 18-gauge spinal needle for localization. A 5.75 mm arthroscopic cannula was inserted through the anterior portal. A full diagnostic examination of the glenohumeral joint was performed.  Findings on arthroscopy included significant fraying of the labrum both anteriorly and superiorly.  Patient was found to have a SLAP tear.  She had a full-thickness chondral lesion in the center of the glenoid measuring approximately 1 cm.  Patient had no anterior labral tear.  The supraspinatus had mild fraying but no full-thickness tear.  The biceps was intact.  The subscapularis was intact.  Patient had generalized chondral fraying of the humeral head.  A 4.0 mm resector shaver blade was used to debride the frayed edges of the labrum.  A chondroplasty of the glenoid was performed.  An anterolateral portal was established again under direct visualization using an 18-gauge spinal needle. A 7 mm cannula was  placed through this anterolateral portal.  A 4.0 mm resector shaver blade was used to  prepare the superior glenoid until punctate bleeding was identified.  Then two Arthrex Bio Suturetak anchors were then placed, one anterior and one posterior to the biceps anchor.   A 90 degree Arthrex suture lasso was then used to shuttle a single limb of each anchor under the superior labrum.  Arthroscopic knot tying technique was then used to approximate the superior labrum to the superior glenoid.  Final arthroscopic images were taken once both anchors were completed.  The arthroscope was then placed in the subacromial space.  A lateral portal was created under direct visualization.  A subacromial decompression was performed using a 5.5 mm resector shaver blade from the lateral portal after extensive bursitis was debrided with a 90 degree ArthroCare wand.  The 5.5 mm resector shaver blade was then placed to the anterior portal and a distal clavicle excision was performed.  All arthroscopic instruments were removed. The four arthroscopic portals were closed with 4-0 nylon. A dry, sterile dressing was applied to the left shoulder, along with a Polar Care sleeve. Patient's left arm was then placed in an abduction sling.  She was awoken and brought to PACU in stable condition. I was scrubbed and present for the entire case and all sharp, sponge and instrument counts were correct at the conclusion the case. I spoke with the patient's friend via phone from the PACU, at the patient's request, to let her know the operation was performed without complication and the patient was stable in the recovery room.      Timoteo Gaul, MD

## 2018-11-03 NOTE — Anesthesia Preprocedure Evaluation (Addendum)
Anesthesia Evaluation  Patient identified by MRN, date of birth, ID band Patient awake    Reviewed: Allergy & Precautions, NPO status , Patient's Chart, lab work & pertinent test results  History of Anesthesia Complications (+) history of anesthetic complications ("aware of being paralized")  Airway Mallampati: II       Dental   Pulmonary neg sleep apnea, neg COPD, Not current smoker, former smoker,           Cardiovascular hypertension, Pt. on medications (-) Past MI and (-) CHF (-) dysrhythmias (-) Valvular Problems/Murmurs     Neuro/Psych neg Seizures Anxiety Depression    GI/Hepatic Neg liver ROS, GERD (rarely)  ,  Endo/Other  neg diabetes  Renal/GU negative Renal ROS     Musculoskeletal   Abdominal   Peds  Hematology  (+) anemia ,   Anesthesia Other Findings   Reproductive/Obstetrics                            Anesthesia Physical Anesthesia Plan  ASA: II  Anesthesia Plan: General   Post-op Pain Management: GA combined w/ Regional for post-op pain   Induction: Intravenous  PONV Risk Score and Plan: 3 and Ondansetron, Dexamethasone and Midazolam  Airway Management Planned: Oral ETT  Additional Equipment:   Intra-op Plan:   Post-operative Plan:   Informed Consent: I have reviewed the patients History and Physical, chart, labs and discussed the procedure including the risks, benefits and alternatives for the proposed anesthesia with the patient or authorized representative who has indicated his/her understanding and acceptance.       Plan Discussed with:   Anesthesia Plan Comments:         Anesthesia Quick Evaluation

## 2018-11-03 NOTE — Anesthesia Postprocedure Evaluation (Signed)
Anesthesia Post Note  Patient: Margaret Warner  Procedure(s) Performed: LEFT SHOULDER ARTHROSCOPIC SUPERIOR LABRUM ANTERIOR AND POSTERIOR REPAIR, SUBACROMIAL DECOMPRESSION, DISTAL CLAVICLE EXCISION (Left Shoulder)  Patient location during evaluation: PACU Anesthesia Type: General Level of consciousness: awake and alert Pain management: pain level controlled Vital Signs Assessment: post-procedure vital signs reviewed and stable Respiratory status: spontaneous breathing and respiratory function stable Cardiovascular status: stable Anesthetic complications: no     Last Vitals:  Vitals:   11/03/18 1130 11/03/18 1200  BP: 105/74 110/74  Pulse: 69 71  Resp: 16 17  Temp: (!) 36.4 C (!) 36.3 C  SpO2:      Last Pain:  Vitals:   11/03/18 1200  TempSrc:   PainSc: 0-No pain                 KEPHART,WILLIAM K

## 2018-11-03 NOTE — Anesthesia Post-op Follow-up Note (Signed)
Anesthesia QCDR form completed.        

## 2018-11-03 NOTE — Transfer of Care (Signed)
Immediate Anesthesia Transfer of Care Note  Patient: Margaret Warner  Procedure(s) Performed: LEFT SHOULDER ARTHROSCOPIC SUPERIOR LABRUM ANTERIOR AND POSTERIOR REPAIR, SUBACROMIAL DECOMPRESSION, DISTAL CLAVICLE EXCISION (Left Shoulder)  Patient Location: PACU  Anesthesia Type:GA combined with regional for post-op pain  Level of Consciousness: awake, alert , oriented and patient cooperative  Airway & Oxygen Therapy: Patient Spontanous Breathing and Patient connected to face mask oxygen  Post-op Assessment: Report given to RN and Post -op Vital signs reviewed and stable  Post vital signs: Reviewed and stable  Last Vitals:  Vitals Value Taken Time  BP 122/54 11/03/18 1021  Temp 36.3 C 11/03/18 1021  Pulse 93 11/03/18 1023  Resp 18 11/03/18 1023  SpO2 100 % 11/03/18 1023  Vitals shown include unvalidated device data.  Last Pain:  Vitals:   11/03/18 1021  TempSrc:   PainSc: 0-No pain         Complications: No apparent anesthesia complications

## 2018-11-03 NOTE — Anesthesia Procedure Notes (Signed)
Procedure Name: Intubation Date/Time: 11/03/2018 8:03 AM Performed by: Eben Burow, CRNA Pre-anesthesia Checklist: Patient identified, Emergency Drugs available, Suction available and Patient being monitored Patient Re-evaluated:Patient Re-evaluated prior to induction Oxygen Delivery Method: Circle system utilized Preoxygenation: Pre-oxygenation with 100% oxygen Induction Type: IV induction Ventilation: Mask ventilation without difficulty Laryngoscope Size: Mac and 3 Grade View: Grade II Tube type: Oral Tube size: 7.0 mm Number of attempts: 1 Airway Equipment and Method: LTA kit utilized Placement Confirmation: ETT inserted through vocal cords under direct vision,  positive ETCO2 and breath sounds checked- equal and bilateral Secured at: 21 cm Tube secured with: Tape Dental Injury: Teeth and Oropharynx as per pre-operative assessment

## 2018-11-03 NOTE — Anesthesia Procedure Notes (Signed)
Anesthesia Regional Block: Interscalene brachial plexus block   Pre-Anesthetic Checklist: ,, timeout performed, Correct Patient, Correct Site, Correct Laterality, Correct Procedure, Correct Position, site marked, Risks and benefits discussed,  Surgical consent,  Pre-op evaluation,  At surgeon's request and post-op pain management  Laterality: Left  Prep: chloraprep       Needles:  Injection technique: Single-shot  Needle Type: Echogenic Needle     Needle Length: 9cm  Needle Gauge: 22     Additional Needles:   Procedures:, nerve stimulator,,, ultrasound used (permanent image in chart),,,,   Nerve Stimulator or Paresthesia:  Response: biceps flexion, 0.8 mA,   Additional Responses:   Narrative:  Start time: 11/03/2018 7:21 AM End time: 11/03/2018 7:26 AM Injection made incrementally with aspirations every 5 mL.  Performed by: Personally   Additional Notes: Functioning IV was confirmed and monitors were applied.  A 56mm 22ga Stimuplex needle was used. Sterile prep and drape,hand hygiene and sterile gloves were used.  Negative aspiration and negative test dose prior to incremental administration of local anesthetic. The patient tolerated the procedure well.

## 2018-11-04 ENCOUNTER — Encounter: Payer: Self-pay | Admitting: Orthopedic Surgery

## 2018-11-04 ENCOUNTER — Other Ambulatory Visit: Payer: Medicare Other

## 2018-11-11 ENCOUNTER — Other Ambulatory Visit: Payer: Medicare Other

## 2018-11-18 ENCOUNTER — Other Ambulatory Visit: Payer: Self-pay

## 2018-11-18 ENCOUNTER — Encounter: Payer: Self-pay | Admitting: Podiatry

## 2018-11-18 ENCOUNTER — Ambulatory Visit (INDEPENDENT_AMBULATORY_CARE_PROVIDER_SITE_OTHER): Payer: Medicare Other | Admitting: Podiatry

## 2018-11-18 DIAGNOSIS — Z9889 Other specified postprocedural states: Secondary | ICD-10-CM

## 2018-11-18 DIAGNOSIS — G5762 Lesion of plantar nerve, left lower limb: Secondary | ICD-10-CM

## 2018-11-18 DIAGNOSIS — G5782 Other specified mononeuropathies of left lower limb: Secondary | ICD-10-CM

## 2018-11-18 NOTE — Progress Notes (Signed)
Subjective:  Patient ID: Margaret Warner, female    DOB: 03-Feb-1950,  MRN: [id number]  Chief Complaint  Patient presents with  . Routine Post Op     DOS 09/25/2018 NEURECTOMY 3RD INTERSPACE LT  "its doing good, Im getting use to my foot being numb and not in pain"    D  68 y.o. female returns for post-op check.  Patient states that she is doing well.  She does not have any pain.  She states that it is completely normal as expected after neurectomy.  She states that she is ambulating in regular shoes and is encouraging that she can wear regular sneakers.  No other acute complaints  Review of Systems: Negative except as noted in the HPI. Denies N/V/F/Ch.  Past Medical History:  Diagnosis Date  . Anemia   . Anxiety   . Arthritis    HANDS, BACK AND KNEES  . Complication of anesthesia    WITH OOPHORECTOMY  . Depression   . GERD (gastroesophageal reflux disease)    RARE-NO MEDS  . Headache   . Hypertension   . Skin cancer    BASAL AND SQUAMOUS CELL    Current Outpatient Medications:  .  acetaminophen (TYLENOL) 325 MG tablet, Take 650 mg by mouth daily as needed for moderate pain or headache. , Disp: , Rfl:  .  ALPRAZolam (XANAX) 0.5 MG tablet, Take 0.5 mg by mouth See admin instructions. Take 0.5 mg at night, and 0.25 mg as needed for anxiety, Disp: , Rfl:  .  ASPERCREME W/LIDOCAINE EX, Apply 1 application topically daily as needed (pain)., Disp: , Rfl:  .  buPROPion (WELLBUTRIN XL) 300 MG 24 hr tablet, Take 300 mg by mouth every morning. , Disp: , Rfl: 4 .  butalbital-acetaminophen-caffeine (FIORICET, ESGIC) 50-325-40 MG tablet, Take 2 tablets by mouth every 4 (four) hours as needed for headache. , Disp: , Rfl:  .  COMBIPATCH 0.05-0.14 MG/DAY, Place 1 patch onto the skin 2 (two) times a week. , Disp: , Rfl:  .  DULoxetine (CYMBALTA) 30 MG capsule, Take 30 mg by mouth every morning. , Disp: , Rfl: 3 .  fluticasone (FLONASE) 50 MCG/ACT nasal spray, Place 1 spray into both  nostrils daily., Disp: , Rfl:  .  gabapentin (NEURONTIN) 300 MG capsule, Take 300 mg by mouth 2 (two) times daily. , Disp: , Rfl:  .  ibuprofen (ADVIL) 200 MG tablet, Take 400 mg by mouth daily as needed for moderate pain., Disp: , Rfl:  .  irbesartan-hydrochlorothiazide (AVALIDE) 300-12.5 MG tablet, Take 1 tablet by mouth every evening. , Disp: , Rfl: 11 .  Lidocaine HCl-Benzyl Alcohol (SALONPAS LIDOCAINE PLUS EX), Apply 0.5 patches topically daily as needed (pain)., Disp: , Rfl:  .  ondansetron (ZOFRAN) 4 MG tablet, Take 1 tablet (4 mg total) by mouth every 8 (eight) hours as needed., Disp: 20 tablet, Rfl: 0 .  oxyCODONE (OXY IR/ROXICODONE) 5 MG immediate release tablet, Take 1 tablet (5 mg total) by mouth every 4 (four) hours as needed for severe pain., Disp: 40 tablet, Rfl: 0 .  rosuvastatin (CRESTOR) 10 MG tablet, Take 10 mg by mouth every other day., Disp: , Rfl:  .  tiZANidine (ZANAFLEX) 4 MG tablet, Take 1-4 mg by mouth See admin instructions. Take 4 mg at night, may take 1 mg during the day as needed for headaches, Disp: , Rfl:  .  valACYclovir (VALTREX) 1000 MG tablet, Take 1,000 mg by mouth 3 (three) times daily as needed (  outbreak). , Disp: , Rfl:  .  valACYclovir (VALTREX) 500 MG tablet, Take 500 mg by mouth daily., Disp: , Rfl:  .  vitamin B-12 (CYANOCOBALAMIN) 1000 MCG tablet, Take 1,000 mcg by mouth daily with lunch., Disp: , Rfl:   Social History   Tobacco Use  Smoking Status Former Smoker  . Packs/day: 2.00  . Years: 15.00  . Pack years: 30.00  . Types: Cigarettes  . Quit date: 02/25/2004  . Years since quitting: 14.7  Smokeless Tobacco Never Used    Allergies  Allergen Reactions  . Tetracycline Swelling and Rash  . Hydrocodone Nausea And Vomiting    Fainting   Objective:  There were no vitals filed for this visit. There is no height or weight on file to calculate BMI. Constitutional Well developed. Well nourished.  Vascular Foot warm and well perfused.  Capillary refill normal to all digits.   Neurologic Normal speech. Oriented to person, place, and time. Epicritic sensation to light touch grossly present bilaterally.  Dermatologic Skin healing well without signs of infection. Skin edges well coapted without signs of infection.  Orthopedic: Tenderness to palpation noted about the surgical site.   Radiographs: None Assessment:  No diagnosis found. Plan:  Patient was evaluated and treated and all questions answered.  S/p foot surgery left -Progressing as expected post-operatively. -XR: None -WB Status: Ambulate in regular sneakers without any limitation -Sutures: None. -Medications: None -Foot redressed. -Patient is able to ambulate in regular sneakers.  She is officially discharged from my care if any further questions I asked the patient to come see [place] right away.  No follow-ups on file.

## 2019-01-31 ENCOUNTER — Ambulatory Visit: Payer: Medicare Other | Attending: Internal Medicine

## 2019-01-31 DIAGNOSIS — Z23 Encounter for immunization: Secondary | ICD-10-CM | POA: Insufficient documentation

## 2019-01-31 NOTE — Progress Notes (Signed)
   Covid-19 Vaccination Clinic  Name:  Margaret Warner    MRN: [id number] DOB: [birth year]  01/31/2019  Margaret Warner was observed post Covid-19 immunization for 15 minutes without incidence. She was provided with Vaccine Information Sheet and instruction to access the V-Safe system.   Margaret Warner was instructed to call 911 with any severe reactions post vaccine: Marland Kitchen Difficulty breathing  . Swelling of your face and throat  . A fast heartbeat  . A bad rash all over your body  . Dizziness and weakness    Immunizations Administered    Name Date Dose VIS Date Route   Pfizer COVID-19 Vaccine 01/31/2019 11:34 AM 0.3 mL 12/18/2018 Intramuscular   Manufacturer: [place]   Lot: [id number]   [place]: [id number]

## 2019-02-05 ENCOUNTER — Ambulatory Visit: Payer: BC Managed Care – PPO

## 2019-02-19 ENCOUNTER — Ambulatory Visit: Payer: Medicare Other | Attending: Internal Medicine

## 2019-02-19 DIAGNOSIS — Z23 Encounter for immunization: Secondary | ICD-10-CM | POA: Insufficient documentation

## 2019-02-19 NOTE — Progress Notes (Signed)
   Covid-19 Vaccination Clinic  Name:  Margaret Warner    MRN: [id number] DOB: 1950-09-10  02/19/2019  [name] was observed post Covid-19 immunization for 15 minutes without incidence. She was provided with Vaccine Information Sheet and instruction to access the V-Safe system.   [name] was instructed to call 911 with any severe reactions post vaccine: Marland Kitchen Difficulty breathing  . Swelling of your face and throat  . A fast heartbeat  . A bad rash all over your body  . Dizziness and weakness    Immunizations Administered    Name Date Dose VIS Date Route   Pfizer COVID-19 Vaccine 02/19/2019 11:09 AM 0.3 mL 12/18/2018 Intramuscular   Manufacturer: [place]   Lot: [id number]   [place]: [id number]

## 2019-02-22 ENCOUNTER — Ambulatory Visit: Payer: BC Managed Care – PPO

## 2019-06-17 ENCOUNTER — Other Ambulatory Visit: Payer: Self-pay | Admitting: Internal Medicine

## 2019-06-17 DIAGNOSIS — Z1231 Encounter for screening mammogram for malignant neoplasm of breast: Secondary | ICD-10-CM

## 2019-10-26 ENCOUNTER — Other Ambulatory Visit: Payer: Self-pay

## 2019-10-26 ENCOUNTER — Ambulatory Visit
Admission: RE | Admit: 2019-10-26 | Discharge: 2019-10-26 | Disposition: A | Discharge status: Home / Self Care | Payer: Medicare Other | Source: Ambulatory Visit | Attending: Internal Medicine | Admitting: Internal Medicine

## 2019-10-26 DIAGNOSIS — Z1231 Encounter for screening mammogram for malignant neoplasm of breast: Secondary | ICD-10-CM | POA: Diagnosis not present

## 2020-06-14 ENCOUNTER — Other Ambulatory Visit: Payer: Self-pay | Admitting: Internal Medicine

## 2020-06-14 DIAGNOSIS — Z1231 Encounter for screening mammogram for malignant neoplasm of breast: Secondary | ICD-10-CM

## 2020-12-05 ENCOUNTER — Other Ambulatory Visit: Payer: Self-pay

## 2020-12-05 ENCOUNTER — Ambulatory Visit
Admission: RE | Admit: 2020-12-05 | Discharge: 2020-12-05 | Disposition: A | Discharge status: Home / Self Care | Payer: Medicare Other | Source: Ambulatory Visit | Attending: Internal Medicine | Admitting: Internal Medicine

## 2020-12-05 DIAGNOSIS — Z1231 Encounter for screening mammogram for malignant neoplasm of breast: Secondary | ICD-10-CM | POA: Diagnosis not present

## 2020-12-15 ENCOUNTER — Other Ambulatory Visit: Payer: Self-pay | Admitting: Internal Medicine

## 2020-12-15 DIAGNOSIS — R1032 Left lower quadrant pain: Secondary | ICD-10-CM

## 2021-01-03 ENCOUNTER — Other Ambulatory Visit: Payer: Self-pay | Admitting: Orthopedic Surgery

## 2021-01-09 ENCOUNTER — Other Ambulatory Visit: Payer: Self-pay

## 2021-01-09 ENCOUNTER — Other Ambulatory Visit
Admission: RE | Admit: 2021-01-09 | Discharge: 2021-01-09 | Disposition: A | Discharge status: Home / Self Care | Payer: Medicare PPO | Source: Ambulatory Visit | Attending: Orthopedic Surgery | Admitting: Orthopedic Surgery

## 2021-01-09 ENCOUNTER — Ambulatory Visit
Admission: RE | Admit: 2021-01-09 | Discharge: 2021-01-09 | Disposition: A | Discharge status: Home / Self Care | Payer: Medicare PPO | Source: Ambulatory Visit | Attending: Internal Medicine | Admitting: Internal Medicine

## 2021-01-09 DIAGNOSIS — R1032 Left lower quadrant pain: Secondary | ICD-10-CM | POA: Diagnosis present

## 2021-01-09 MED ORDER — IOHEXOL 300 MG/ML  SOLN
100.0000 mL | Freq: Once | INTRAMUSCULAR | Status: AC | PRN
  Administered 2021-01-09: 100 mL via INTRAVENOUS

## 2021-01-09 NOTE — Patient Instructions (Signed)
Your procedure is scheduled on: Thursday January 18, 2021. Report to Day Surgery inside [facility] 2nd floor.  To find out your arrival time please call [phone] between 1PM - 3PM on Wednesday January 17, 2021.  Remember: Instructions that are not followed completely may result in serious medical risk,  up to and including death, or upon the discretion of your surgeon and anesthesiologist your  surgery may need to be rescheduled.     _X__ 1. Do not eat food or drink fluids after midnight the night before your procedure.                 No chewing gum or hard candies.  __X__2.  On the morning of surgery brush your teeth with toothpaste and water, you                may rinse your mouth with mouthwash if you wish.  Do not swallow any toothpaste or mouthwash.     _X__ 3.  No Alcohol for 24 hours before or after surgery.   _X__ 4.  Do Not Smoke or use e-cigarettes For 24 Hours Prior to Your Surgery.                 Do not use any chewable tobacco products for at least 6 hours prior to                 Surgery.  _X__  5.  Do not use any recreational drugs (marijuana, cocaine, heroin, ecstasy, MDMA or other)                For at least one week prior to your surgery.  Combination of these drugs with anesthesia                May have life threatening results.  __X__6.  Notify your doctor if there is any change in your medical condition      (cold, fever, infections).     Do not wear jewelry, make-up, hairpins, clips or nail polish. Do not wear lotions, powders, or perfumes. You may wear deodorant. Do not shave 48 hours prior to surgery. Men may shave face and neck. Do not bring valuables to the hospital.    [facility] is not responsible for any belongings or valuables.  Contacts, dentures or bridgework may not be worn into surgery. Leave your suitcase in the car. After surgery it may be brought to your room. For patients admitted to the hospital, discharge  time is determined by your treatment team.   Patients discharged the day of surgery will not be allowed to drive home.   Make arrangements for someone to be with you for the first 24 hours of your Same Day Discharge.   __X__ Take these medicines the morning of surgery with A SIP OF WATER:    1. buPROPion (WELLBUTRIN XL) 300 MG  2. atorvastatin (LIPITOR) 10 MG  3. DULoxetine (CYMBALTA) 30 MG capsule  4.  5.  6.  ____ Fleet Enema (as directed)   __X__ Use CHG Soap (or wipes) as directed  ____ Use Benzoyl Peroxide Gel as instructed  ____ Use inhalers on the day of surgery  ____ Stop metformin 2 days prior to surgery    ____ Take 1/2 of usual insulin dose the night before surgery. No insulin the morning          of surgery.   ____ Call your PCP, cardiologist, or Pulmonologist if taking Coumadin/Plavix/aspirin and  ask when to stop before your surgery.   ___X_ One Week prior to surgery- Stop Anti-inflammatories such as Ibuprofen, Aleve, Advil, Motrin, meloxicam (MOBIC), diclofenac, etodolac, ketorolac, Toradol, Daypro, piroxicam, Goody's or BC powders. OK TO USE TYLENOL IF NEEDED   __X__ Stop supplements until after surgery.    ____ Bring C-Pap to the hospital.    If you have any questions regarding your pre-procedure instructions,  Please call Pre-admit Testing at [phone]

## 2021-01-11 ENCOUNTER — Other Ambulatory Visit
Admission: RE | Admit: 2021-01-11 | Discharge: 2021-01-11 | Disposition: A | Discharge status: Home / Self Care | Payer: Medicare PPO | Source: Ambulatory Visit | Attending: Orthopedic Surgery | Admitting: Orthopedic Surgery

## 2021-01-11 ENCOUNTER — Other Ambulatory Visit: Payer: Self-pay

## 2021-01-11 DIAGNOSIS — Z01810 Encounter for preprocedural cardiovascular examination: Secondary | ICD-10-CM | POA: Diagnosis present

## 2021-01-11 DIAGNOSIS — I10 Essential (primary) hypertension: Secondary | ICD-10-CM | POA: Insufficient documentation

## 2021-01-22 MED ORDER — CHLORHEXIDINE GLUCONATE CLOTH 2 % EX PADS
6.0000 | MEDICATED_PAD | Freq: Once | CUTANEOUS | Status: AC
  Administered 2021-01-23: 6 via TOPICAL

## 2021-01-22 MED ORDER — FAMOTIDINE 20 MG PO TABS: 20.0000 mg | ORAL_TABLET | Freq: Once | ORAL | Status: AC

## 2021-01-22 MED ORDER — CHLORHEXIDINE GLUCONATE CLOTH 2 % EX PADS: 6.0000 | MEDICATED_PAD | Freq: Once | CUTANEOUS | Status: DC

## 2021-01-22 MED ORDER — ACETAMINOPHEN 500 MG PO TABS
1000.0000 mg | ORAL_TABLET | ORAL | Status: AC
Start: 2021-01-23 — End: 2021-01-23

## 2021-01-22 MED ORDER — CEFAZOLIN SODIUM-DEXTROSE 2-4 GM/100ML-% IV SOLN
2.0000 g | INTRAVENOUS | Status: AC
  Administered 2021-01-23: 2 g via INTRAVENOUS

## 2021-01-22 MED ORDER — CHLORHEXIDINE GLUCONATE 0.12 % MT SOLN: 15.0000 mL | Freq: Once | OROMUCOSAL | Status: AC

## 2021-01-22 MED ORDER — LACTATED RINGERS IV SOLN: INTRAVENOUS | Status: DC

## 2021-01-22 MED ORDER — ORAL CARE MOUTH RINSE: 15.0000 mL | Freq: Once | OROMUCOSAL | Status: AC

## 2021-01-23 ENCOUNTER — Ambulatory Visit: Payer: Medicare PPO

## 2021-01-23 ENCOUNTER — Other Ambulatory Visit: Payer: Self-pay

## 2021-01-23 ENCOUNTER — Encounter
Admission: RE | Disposition: A | Discharge status: Home / Self Care | Payer: Self-pay | Source: Home / Self Care | Attending: Orthopedic Surgery

## 2021-01-23 ENCOUNTER — Ambulatory Visit: Payer: Medicare PPO | Admitting: Anesthesiology

## 2021-01-23 ENCOUNTER — Ambulatory Visit
Admission: RE | Admit: 2021-01-23 | Discharge: 2021-01-23 | Disposition: A | Discharge status: Home / Self Care | Payer: Medicare PPO | Attending: Orthopedic Surgery | Admitting: Orthopedic Surgery

## 2021-01-23 ENCOUNTER — Encounter: Payer: Self-pay | Admitting: Orthopedic Surgery

## 2021-01-23 DIAGNOSIS — F418 Other specified anxiety disorders: Secondary | ICD-10-CM | POA: Insufficient documentation

## 2021-01-23 DIAGNOSIS — M19011 Primary osteoarthritis, right shoulder: Secondary | ICD-10-CM | POA: Diagnosis not present

## 2021-01-23 DIAGNOSIS — Z419 Encounter for procedure for purposes other than remedying health state, unspecified: Secondary | ICD-10-CM

## 2021-01-23 DIAGNOSIS — I10 Essential (primary) hypertension: Secondary | ICD-10-CM | POA: Diagnosis not present

## 2021-01-23 DIAGNOSIS — F1910 Other psychoactive substance abuse, uncomplicated: Secondary | ICD-10-CM | POA: Diagnosis not present

## 2021-01-23 DIAGNOSIS — Z87891 Personal history of nicotine dependence: Secondary | ICD-10-CM | POA: Diagnosis not present

## 2021-01-23 DIAGNOSIS — M75101 Unspecified rotator cuff tear or rupture of right shoulder, not specified as traumatic: Secondary | ICD-10-CM | POA: Insufficient documentation

## 2021-01-23 DIAGNOSIS — F1290 Cannabis use, unspecified, uncomplicated: Secondary | ICD-10-CM | POA: Insufficient documentation

## 2021-01-23 HISTORY — DX: Leiomyoma of uterus, unspecified: D25.9

## 2021-01-23 HISTORY — PX: SHOULDER ARTHROSCOPY WITH OPEN ROTATOR CUFF REPAIR AND DISTAL CLAVICLE ACROMINECTOMY: SHX5683

## 2021-01-23 SURGERY — SHOULDER ARTHROSCOPY WITH OPEN ROTATOR CUFF REPAIR AND DISTAL CLAVICLE ACROMINECTOMY
Anesthesia: General | Site: Shoulder | Laterality: Right

## 2021-01-23 MED ORDER — ONDANSETRON HCL 4 MG/2ML IJ SOLN
INTRAMUSCULAR | Status: AC
  Filled 2021-01-23: qty 2

## 2021-01-23 MED ORDER — FENTANYL CITRATE (PF) 100 MCG/2ML IJ SOLN
INTRAMUSCULAR | Status: AC
  Filled 2021-01-23: qty 2

## 2021-01-23 MED ORDER — SODIUM CHLORIDE 0.9 % IR SOLN: Status: DC | PRN

## 2021-01-23 MED ORDER — PHENYLEPHRINE HCL (PRESSORS) 10 MG/ML IV SOLN
INTRAVENOUS | Status: DC | PRN
  Administered 2021-01-23: 160 ug via INTRAVENOUS
  Administered 2021-01-23: 80 ug via INTRAVENOUS
  Administered 2021-01-23 (×4): 160 ug via INTRAVENOUS
  Administered 2021-01-23: 240 ug via INTRAVENOUS

## 2021-01-23 MED ORDER — FENTANYL CITRATE (PF) 100 MCG/2ML IJ SOLN
INTRAMUSCULAR | Status: DC | PRN
  Administered 2021-01-23: 25 ug via INTRAVENOUS

## 2021-01-23 MED ORDER — LIDOCAINE HCL (CARDIAC) PF 100 MG/5ML IV SOSY
PREFILLED_SYRINGE | INTRAVENOUS | Status: DC | PRN
  Administered 2021-01-23: 40 mg via INTRAVENOUS

## 2021-01-23 MED ORDER — EPINEPHRINE PF 1 MG/ML IJ SOLN
INTRAMUSCULAR | Status: AC
  Filled 2021-01-23: qty 4

## 2021-01-23 MED ORDER — CHLORHEXIDINE GLUCONATE 0.12 % MT SOLN
OROMUCOSAL | Status: AC
  Administered 2021-01-23: 15 mL via OROMUCOSAL
  Filled 2021-01-23: qty 15

## 2021-01-23 MED ORDER — EPHEDRINE SULFATE 50 MG/ML IJ SOLN
INTRAMUSCULAR | Status: DC | PRN
  Administered 2021-01-23: 2.5 mg via INTRAVENOUS

## 2021-01-23 MED ORDER — BUPIVACAINE HCL (PF) 0.5 % IJ SOLN
INTRAMUSCULAR | Status: DC | PRN
Start: 2021-01-23 — End: 2021-01-23
  Administered 2021-01-23: 10 mL via PERINEURAL

## 2021-01-23 MED ORDER — OXYCODONE HCL 5 MG PO TABS
5.0000 mg | ORAL_TABLET | Freq: Once | ORAL | Status: AC
  Administered 2021-01-23: 5 mg via ORAL

## 2021-01-23 MED ORDER — ROCURONIUM BROMIDE 10 MG/ML (PF) SYRINGE
PREFILLED_SYRINGE | INTRAVENOUS | Status: AC
  Filled 2021-01-23: qty 10

## 2021-01-23 MED ORDER — LACTATED RINGERS IR SOLN: Status: DC | PRN

## 2021-01-23 MED ORDER — LIDOCAINE HCL (PF) 1 % IJ SOLN
INTRAMUSCULAR | Status: AC
  Filled 2021-01-23: qty 2

## 2021-01-23 MED ORDER — MIDAZOLAM HCL 2 MG/2ML IJ SOLN: 1.0000 mg | INTRAMUSCULAR | Status: DC | PRN

## 2021-01-23 MED ORDER — MIDAZOLAM HCL 2 MG/2ML IJ SOLN
INTRAMUSCULAR | Status: AC
  Administered 2021-01-23: 2 mg via INTRAVENOUS
  Filled 2021-01-23: qty 2

## 2021-01-23 MED ORDER — FAMOTIDINE 20 MG PO TABS
ORAL_TABLET | ORAL | Status: AC
  Administered 2021-01-23: 20 mg via ORAL
  Filled 2021-01-23: qty 1

## 2021-01-23 MED ORDER — BUPIVACAINE LIPOSOME 1.3 % IJ SUSP
INTRAMUSCULAR | Status: AC
  Filled 2021-01-23: qty 10

## 2021-01-23 MED ORDER — ONDANSETRON HCL 4 MG/2ML IJ SOLN
INTRAMUSCULAR | Status: DC | PRN
Start: 2021-01-23 — End: 2021-01-23
  Administered 2021-01-23: 4 mg via INTRAVENOUS

## 2021-01-23 MED ORDER — OXYCODONE HCL 5 MG PO TABS
ORAL_TABLET | ORAL | Status: AC
  Filled 2021-01-23: qty 1

## 2021-01-23 MED ORDER — ACETAMINOPHEN 500 MG PO TABS
ORAL_TABLET | ORAL | Status: AC
  Administered 2021-01-23: 1000 mg via ORAL
  Filled 2021-01-23: qty 2

## 2021-01-23 MED ORDER — OXYCODONE HCL 5 MG PO TABS: 5.0000 mg | ORAL_TABLET | ORAL | 0 refills | Status: AC | PRN

## 2021-01-23 MED ORDER — MIDAZOLAM HCL 2 MG/2ML IJ SOLN
INTRAMUSCULAR | Status: DC | PRN
  Administered 2021-01-23: 2 mg via INTRAVENOUS

## 2021-01-23 MED ORDER — EPHEDRINE 5 MG/ML INJ
INTRAVENOUS | Status: AC
  Filled 2021-01-23: qty 5

## 2021-01-23 MED ORDER — FENTANYL CITRATE PF 50 MCG/ML IJ SOSY: 50.0000 ug | PREFILLED_SYRINGE | INTRAMUSCULAR | Status: DC | PRN

## 2021-01-23 MED ORDER — ROCURONIUM BROMIDE 100 MG/10ML IV SOLN
INTRAVENOUS | Status: DC | PRN
  Administered 2021-01-23: 10 mg via INTRAVENOUS
  Administered 2021-01-23: 40 mg via INTRAVENOUS
  Administered 2021-01-23: 30 mg via INTRAVENOUS
  Administered 2021-01-23: 20 mg via INTRAVENOUS

## 2021-01-23 MED ORDER — PHENYLEPHRINE HCL-NACL 20-0.9 MG/250ML-% IV SOLN
INTRAVENOUS | Status: AC
  Filled 2021-01-23: qty 250

## 2021-01-23 MED ORDER — ONDANSETRON HCL 4 MG PO TABS: 4.0000 mg | ORAL_TABLET | Freq: Three times a day (TID) | ORAL | 0 refills | Status: AC | PRN

## 2021-01-23 MED ORDER — NEOMYCIN-POLYMYXIN B GU 40-200000 IR SOLN
Status: AC
  Filled 2021-01-23: qty 2

## 2021-01-23 MED ORDER — LACTATED RINGERS IR SOLN
Status: DC | PRN
  Administered 2021-01-23 (×2): 3000 mL

## 2021-01-23 MED ORDER — LIDOCAINE HCL (PF) 1 % IJ SOLN
INTRAMUSCULAR | Status: DC | PRN
  Administered 2021-01-23: 1 mL via SUBCUTANEOUS

## 2021-01-23 MED ORDER — LIDOCAINE HCL (PF) 1 % IJ SOLN
INTRAMUSCULAR | Status: AC
  Filled 2021-01-23: qty 30

## 2021-01-23 MED ORDER — GLYCOPYRROLATE 0.2 MG/ML IJ SOLN
INTRAMUSCULAR | Status: AC
  Filled 2021-01-23: qty 1

## 2021-01-23 MED ORDER — DEXAMETHASONE SODIUM PHOSPHATE 10 MG/ML IJ SOLN
INTRAMUSCULAR | Status: DC | PRN
  Administered 2021-01-23: 4 mg via INTRAVENOUS

## 2021-01-23 MED ORDER — BUPIVACAINE LIPOSOME 1.3 % IJ SUSP
INTRAMUSCULAR | Status: DC | PRN
Start: 2021-01-23 — End: 2021-01-23
  Administered 2021-01-23: 10 mL via PERINEURAL

## 2021-01-23 MED ORDER — PROPOFOL 10 MG/ML IV BOLUS
INTRAVENOUS | Status: DC | PRN
Start: 2021-01-23 — End: 2021-01-23
  Administered 2021-01-23 (×2): 30 mg via INTRAVENOUS
  Administered 2021-01-23: 120 mg via INTRAVENOUS
  Administered 2021-01-23: 20 mg via INTRAVENOUS

## 2021-01-23 MED ORDER — PHENYLEPHRINE HCL-NACL 20-0.9 MG/250ML-% IV SOLN
INTRAVENOUS | Status: DC | PRN
  Administered 2021-01-23: 40 ug/min via INTRAVENOUS

## 2021-01-23 MED ORDER — MIDAZOLAM HCL 2 MG/2ML IJ SOLN
INTRAMUSCULAR | Status: AC
  Filled 2021-01-23: qty 2

## 2021-01-23 MED ORDER — BUPIVACAINE HCL (PF) 0.25 % IJ SOLN
INTRAMUSCULAR | Status: AC
  Filled 2021-01-23: qty 30

## 2021-01-23 MED ORDER — GLYCOPYRROLATE 0.2 MG/ML IJ SOLN
INTRAMUSCULAR | Status: DC | PRN
  Administered 2021-01-23 (×2): .2 mg via INTRAVENOUS

## 2021-01-23 MED ORDER — SUGAMMADEX SODIUM 200 MG/2ML IV SOLN
INTRAVENOUS | Status: DC | PRN
Start: 2021-01-23 — End: 2021-01-23
  Administered 2021-01-23: 120 mg via INTRAVENOUS

## 2021-01-23 MED ORDER — LIDOCAINE HCL (PF) 2 % IJ SOLN
INTRAMUSCULAR | Status: AC
  Filled 2021-01-23: qty 5

## 2021-01-23 MED ORDER — CEFAZOLIN SODIUM-DEXTROSE 2-4 GM/100ML-% IV SOLN
INTRAVENOUS | Status: AC
  Filled 2021-01-23: qty 100

## 2021-01-23 MED ORDER — BUPIVACAINE HCL (PF) 0.5 % IJ SOLN: INTRAMUSCULAR | Status: DC | PRN

## 2021-01-23 MED ORDER — PROPOFOL 10 MG/ML IV BOLUS
INTRAVENOUS | Status: AC
  Filled 2021-01-23: qty 20

## 2021-01-23 MED ORDER — BUPIVACAINE HCL (PF) 0.5 % IJ SOLN
INTRAMUSCULAR | Status: AC
  Filled 2021-01-23: qty 10

## 2021-01-23 SURGICAL SUPPLY — 68 items
ADAPTER IRRIG TUBE 2 SPIKE SOL (ADAPTER) ×4 IMPLANT
ANCHOR ALL-SUT Q-FIX 2.8 (Anchor) ×5 IMPLANT
ANCHOR SUT 5.5 MULTIFIX (Orthopedic Implant) ×1 IMPLANT
CANISTER SUCT LVC 12 LTR MEDI- (MISCELLANEOUS) ×1 IMPLANT
CANNULA 5.75X7 CRYSTAL CLEAR (CANNULA) ×3 IMPLANT
CANNULA PARTIAL THREAD 2X7 (CANNULA) ×1 IMPLANT
CANNULA TWIST IN 8.25X9CM (CANNULA) ×2 IMPLANT
CONNECTOR PERFECT PASSER (CONNECTOR) ×3 IMPLANT
COOLER POLAR GLACIER W/PUMP (MISCELLANEOUS) ×2 IMPLANT
DEVICE SUCT BLK HOLE OR FLOOR (MISCELLANEOUS) ×2 IMPLANT
DRAPE 3/4 80X56 (DRAPES) ×2 IMPLANT
DRAPE IMP U-DRAPE 54X76 (DRAPES) ×4 IMPLANT
DRAPE INCISE IOBAN 66X45 STRL (DRAPES) ×2 IMPLANT
DRAPE U-SHAPE 47X51 STRL (DRAPES) ×3 IMPLANT
DURAPREP 26ML APPLICATOR (WOUND CARE) ×6 IMPLANT
ELECT REM PT RETURN 9FT ADLT (ELECTROSURGICAL) ×2
ELECTRODE REM PT RTRN 9FT ADLT (ELECTROSURGICAL) ×1 IMPLANT
GAUZE SPONGE 4X4 12PLY STRL (GAUZE/BANDAGES/DRESSINGS) ×2 IMPLANT
GAUZE XEROFORM 1X8 LF (GAUZE/BANDAGES/DRESSINGS) ×2 IMPLANT
GLOVE SURG ORTHO LTX SZ9 (GLOVE) ×6 IMPLANT
GLOVE SURG UNDER POLY LF SZ9 (GLOVE) ×2 IMPLANT
GOWN STRL REUS TWL 2XL XL LVL4 (GOWN DISPOSABLE) ×2 IMPLANT
GOWN STRL REUS W/ TWL LRG LVL3 (GOWN DISPOSABLE) ×1 IMPLANT
GOWN STRL REUS W/TWL LRG LVL3 (GOWN DISPOSABLE) ×1
IV LACTATED RINGER IRRG 3000ML (IV SOLUTION) ×6
IV LR IRRIG 3000ML ARTHROMATIC (IV SOLUTION) ×8 IMPLANT
KIT STABILIZATION SHOULDER (MISCELLANEOUS) ×2 IMPLANT
KIT SUTURE 2.8 Q-FIX DISP (MISCELLANEOUS) ×1 IMPLANT
KIT SUTURETAK 3.0 INSERT PERC (KITS) IMPLANT
KIT TURNOVER KIT A (KITS) ×2 IMPLANT
MANIFOLD NEPTUNE II (INSTRUMENTS) ×3 IMPLANT
MASK FACE SPIDER DISP (MASK) ×2 IMPLANT
MAT ABSORB  FLUID 56X50 GRAY (MISCELLANEOUS) ×3
MAT ABSORB FLUID 56X50 GRAY (MISCELLANEOUS) ×3 IMPLANT
NDL SAFETY ECLIPSE 18X1.5 (NEEDLE) ×1 IMPLANT
NEEDLE HYPO 18GX1.5 SHARP (NEEDLE) ×1
NEEDLE HYPO 22GX1.5 SAFETY (NEEDLE) ×1 IMPLANT
NS IRRIG 500ML POUR BTL (IV SOLUTION) ×2 IMPLANT
PACK ARTHROSCOPY SHOULDER (MISCELLANEOUS) ×2 IMPLANT
PAD ARMBOARD 7.5X6 YLW CONV (MISCELLANEOUS) ×5 IMPLANT
PAD WRAPON POLAR SHDR XLG (MISCELLANEOUS) ×1 IMPLANT
PASSER SUT FIRSTPASS SELF (INSTRUMENTS) ×2 IMPLANT
SHAVER BLADE BONE CUTTER 4.5 (BLADE) ×2 IMPLANT
SHAVER BLADE TAPERED BLUNT 4 (BLADE) ×2 IMPLANT
SPONGE T-LAP 18X18 ~~LOC~~+RFID (SPONGE) ×1 IMPLANT
STRIP CLOSURE SKIN 1/2X4 (GAUZE/BANDAGES/DRESSINGS) ×2 IMPLANT
SUT ETHILON 4 0 PS 2 18 (SUTURE) ×1 IMPLANT
SUT ETHILON 4-0 (SUTURE) ×1
SUT ETHILON 4-0 FS2 18XMFL BLK (SUTURE) ×1
SUT LASSO 90 DEG SD STR (SUTURE) IMPLANT
SUT MNCRL 4-0 (SUTURE) ×1
SUT MNCRL 4-0 27XMFL (SUTURE) ×1
SUT PDS AB 0 CT1 27 (SUTURE) ×5 IMPLANT
SUT PERFECTPASSER WHITE CART (SUTURE) ×5 IMPLANT
SUT SMART STITCH CARTRIDGE (SUTURE) ×6 IMPLANT
SUT ULTRABRAID 2 COBRAID 38 (SUTURE) IMPLANT
SUT VIC AB 0 CT1 36 (SUTURE) ×5 IMPLANT
SUT VIC AB 2-0 CT2 27 (SUTURE) ×3 IMPLANT
SUTURE ETHLN 4-0 FS2 18XMF BLK (SUTURE) ×1 IMPLANT
SUTURE MNCRL 4-0 27XMF (SUTURE) ×1 IMPLANT
SYR 10ML LL (SYRINGE) ×3 IMPLANT
TAPE MICROFOAM 4IN (TAPE) ×2 IMPLANT
TUBING CONNECTING 10 (TUBING) ×1 IMPLANT
TUBING INFLOW SET DBFLO PUMP (TUBING) ×2 IMPLANT
TUBING OUTFLOW SET DBLFO PUMP (TUBING) ×2 IMPLANT
WAND WEREWOLF FLOW 90D (MISCELLANEOUS) ×2 IMPLANT
WATER STERILE IRR 500ML POUR (IV SOLUTION) ×2 IMPLANT
WRAPON POLAR PAD SHDR XLG (MISCELLANEOUS) ×2

## 2021-01-23 NOTE — Anesthesia Procedure Notes (Addendum)
Procedure Name: Intubation Date/Time: 01/23/2021 11:57 AM Performed by: Debe Coder, CRNA Pre-anesthesia Checklist: Patient identified, Emergency Drugs available, Suction available and Patient being monitored Patient Re-evaluated:Patient Re-evaluated prior to induction Oxygen Delivery Method: Circle system utilized Preoxygenation: Pre-oxygenation with 100% oxygen Induction Type: IV induction Ventilation: Mask ventilation without difficulty Laryngoscope Size: Miller and 2 Grade View: Grade I Tube type: Oral Tube size: 7.0 mm Number of attempts: 1 Airway Equipment and Method: Stylet and Oral airway Placement Confirmation: ETT inserted through vocal cords under direct vision, positive ETCO2 and breath sounds checked- equal and bilateral Secured at: 22 cm Tube secured with: Tape Dental Injury: Teeth and Oropharynx as per pre-operative assessment

## 2021-01-23 NOTE — H&P (Signed)
PREOPERATIVE H&P  Chief Complaint: Right Shoulder Rotator Cuff Tear  HPI: Margaret Warner is a 71 y.o. female who presents for preoperative history and physical with a diagnosis of high-grade partial versus full-thickness right Shoulder Rotator Cuff Tear confirmed by MRI. Symptoms of right shoulder pain and weakness are significantly impairing activities of daily living.  Patient wished to proceed with surgical fixation of her right shoulder rotator cuff tear.   Past Medical History:  Diagnosis Date   Anemia    Anxiety    Arthritis    HANDS, BACK AND KNEES   Complication of anesthesia    WITH OOPHORECTOMY   Depression    GERD (gastroesophageal reflux disease)    RARE-NO MEDS   Headache    Hypertension    Skin cancer    BASAL AND SQUAMOUS CELL   Uterine fibroid    Past Surgical History:  Procedure Laterality Date   BACK SURGERY     LUMBAR   BILATERAL OOPHORECTOMY  1974   BREAST EXCISIONAL BIOPSY Left 2011   EXCISIONAL - NEG lumpectomy   FACIAL COSMETIC SURGERY     HARDWARE REMOVAL Left 08/27/2016   Procedure: HARDWARE REMOVAL;  Surgeon: Thornton Park, MD;  Location: [facility];  Service: Orthopedics;  Laterality: Left;   ORIF ANKLE FRACTURE Left 02/26/2016   Procedure: OPEN REDUCTION INTERNAL FIXATION (ORIF) ANKLE FRACTURE;  Surgeon: Thornton Park, MD;  Location: [facility];  Service: Orthopedics;  Laterality: Left;   SHOULDER ARTHROSCOPY WITH OPEN ROTATOR CUFF REPAIR Left 11/03/2018   Procedure: LEFT SHOULDER ARTHROSCOPIC SUPERIOR LABRUM ANTERIOR AND POSTERIOR REPAIR, SUBACROMIAL DECOMPRESSION, DISTAL CLAVICLE EXCISION;  Surgeon: Thornton Park, MD;  Location: [facility];  Service: Orthopedics;  Laterality: Left;   Social History   Socioeconomic History   Marital status: Divorced    Spouse name: Not on file   Number of children: Not on file   Years of education: Not on file   Highest education level: Not on file  Occupational History   Not on file  Tobacco Use    Smoking status: Former    Packs/day: 2.00    Years: 15.00    Pack years: 30.00    Types: Cigarettes    Quit date: 02/25/2004    Years since quitting: 16.9   Smokeless tobacco: Never  Vaping Use   Vaping Use: Some days   Substances: Nicotine  Substance and Sexual Activity   Alcohol use: Not Currently    Alcohol/week: 1.0 standard drink    Types: 1 Standard drinks or equivalent per week    Comment: occa   Drug use: Yes    Types: Marijuana    Comment: occa   Sexual activity: Yes  Other Topics Concern   Not on file  Social History Narrative   Not on file   Social Determinants of Health   Financial Resource Strain: Not on file  Food Insecurity: Not on file  Transportation Needs: Not on file  Physical Activity: Not on file  Stress: Not on file  Social Connections: Not on file   Family History  Problem Relation Age of Onset   Breast cancer Neg Hx    Allergies  Allergen Reactions   Tetracycline Swelling and Rash   Hydrocodone Nausea And Vomiting    Fainting   Prior to Admission medications   Medication Sig Start Date End Date Taking? Authorizing Provider  acetaminophen (TYLENOL) 325 MG tablet Take 650 mg by mouth daily as needed for moderate pain or headache.    Yes  [provider]  ALPRAZolam Duanne Moron) 0.5 MG tablet Take 0.5 mg by mouth See admin instructions. Take 0.5 mg at night, and 0.25 mg daily as needed for anxiety   Yes [provider]  ARIPiprazole (ABILIFY) 2 MG tablet Take 2 mg by mouth daily.   Yes [provider]  atorvastatin (LIPITOR) 10 MG tablet Take 10 mg by mouth daily.   Yes [provider]  buPROPion (WELLBUTRIN XL) 300 MG 24 hr tablet Take 300 mg by mouth every morning.  01/28/16  Yes [provider]  butalbital-acetaminophen-caffeine (FIORICET, ESGIC) 50-325-40 MG tablet Take 2 tablets by mouth every 4 (four) hours as needed for headache.  02/05/16  Yes [provider]  Cholecalciferol (VITAMIN D)  50 MCG (2000 UT) tablet Take 2,000 Units by mouth daily.   Yes [provider]  COMBIPATCH 0.05-0.14 MG/DAY Place 1 patch onto the skin 2 (two) times a week.  02/04/16  Yes [provider]  DULoxetine (CYMBALTA) 30 MG capsule Take 30 mg by mouth every morning.  12/14/15  Yes [provider]  fluticasone (FLONASE) 50 MCG/ACT nasal spray Place 1 spray into both nostrils daily.   Yes [provider]  gabapentin (NEURONTIN) 100 MG capsule Take 100 mg by mouth at bedtime. 02/22/16  Yes [provider]  ibuprofen (ADVIL) 200 MG tablet Take 400 mg by mouth daily as needed for moderate pain.   Yes [provider]  irbesartan-hydrochlorothiazide (AVALIDE) 300-12.5 MG tablet Take 1 tablet by mouth every evening.  10/28/17  Yes [provider]  Lidocaine HCl-Benzyl Alcohol (SALONPAS LIDOCAINE PLUS EX) Apply 0.5 patches topically daily as needed (pain). Patient not taking: Reported on 01/09/2021   Yes [provider]  oxyCODONE (OXY IR/ROXICODONE) 5 MG immediate release tablet Take 1 tablet (5 mg total) by mouth every 4 (four) hours as needed for severe pain. 11/03/18  Yes Thornton Park, MD  rosuvastatin (CRESTOR) 10 MG tablet Take 10 mg by mouth daily. Patient not taking: Reported on 01/09/2021   Yes [provider]  tiZANidine (ZANAFLEX) 4 MG tablet Take 4 mg by mouth at bedtime.   Yes [provider]  valACYclovir (VALTREX) 1000 MG tablet Take 1,000 mg by mouth 3 (three) times daily as needed (outbreak).  12/02/16  Yes [provider]  vitamin B-12 (CYANOCOBALAMIN) 1000 MCG tablet Take 1,000 mcg by mouth daily with lunch.   Yes [provider]     Positive ROS: All other systems have been reviewed and were otherwise negative with the exception of those mentioned in the HPI and as above.  Physical Exam: General: Alert, no acute distress Cardiovascular: Regular rate and rhythm, no murmurs rubs or  gallops.  No pedal edema Respiratory: Clear to auscultation bilaterally, no wheezes rales or rhonchi. No cyanosis, no use of accessory musculature GI: No organomegaly, abdomen is soft and non-tender nondistended with positive bowel sounds. Skin: Skin intact, no lesions within the operative field. Neurologic: Sensation intact distally Psychiatric: Patient is competent for consent with normal mood and affect Lymphatic: No cervical lymphadenopathy  MUSCULOSKELETAL: Right Shoulder: Patient had 2 cat scratches on right side of the base of her neck which have begun to scab over.   there is no evidence of drainage or infection.  Patient has had an interscalene block this morning and has weakness in her right arm as a result.  Previously in the office, she could forward elevate and abduct to approximately 150 degrees, but has pain in mid range  of abduction. She was neurovascularly intact in the office and had full digital, wrist and elbow range of motion. The patient demonstrated pain with a downward directed force on her abducted shoulder.   Radiology: I have reviewed the patient's MRI of the right shoulder. She has a near full thickness 8 mm tear involving the supraspinatus tendon. There is associated tendinosis and moderate bursitis. She has mild muscle fatty atrophy. 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   Assessment: Right Shoulder Rotator Cuff Tear  Plan: Plan for Procedure(s): Right shoulder arthroscopic subacromial decompression, distal clavicle excision and mini open rotator cuff repair  Patient was met in the preoperative area.  A preop history and physical was performed.  I marked the right shoulder according to hospital's correct site of surgery protocol.  I  reviewed the details of the operation as well as the postoperative course and instructions with the patient.  I discussed the risks and benefits of surgery. The risks include but are not limited to infection, bleeding nerve or blood vessel injury, joint stiffness or loss of motion, persistent pain, weakness or instability, retear of the rotator cuff and hardware failure and the need for further surgery.  Patient is aware of the risks and benefits of this procedure having been through arthroscopic left shoulder surgery with me previously.  The patient understands these risks and wished to proceed.    Thornton Park, MD   01/23/2021 11:46 AM

## 2021-01-23 NOTE — Discharge Instructions (Signed)
AMBULATORY SURGERY  ?DISCHARGE INSTRUCTIONS ? ? ?The drugs that you were given will stay in your system until tomorrow so for the next 24 hours you should not: ? ?Drive an automobile ?Make any legal decisions ?Drink any alcoholic beverage ? ? ?You may resume regular meals tomorrow.  Today it is better to start with liquids and gradually work up to solid foods. ? ?You may eat anything you prefer, but it is better to start with liquids, then soup and crackers, and gradually work up to solid foods. ? ? ?Please notify your doctor immediately if you have any unusual bleeding, trouble breathing, redness and pain at the surgery site, drainage, fever, or pain not relieved by medication. ? ? ? ?Additional Instructions: ? ? ? ?Please contact your physician with any problems or Same Day Surgery at [phone], Monday through Friday 6 am to 4 pm, or [facility] [place] Main number at [phone].  ?

## 2021-01-23 NOTE — Transfer of Care (Signed)
Immediate Anesthesia Transfer of Care Note  Patient: Margaret Warner  Procedure(s) Performed: SHOULDER ARTHROSCOPY WITH OPEN ROTATOR CUFF REPAIR AND DISTAL CLAVICLE ACROMINECTOMY (Right: Shoulder) SHOULDER ARTHROSCOPY WITH SUBACROMIAL DECOMPRESSION AND DISTAL CLAVICLE EXCISION (Right: Shoulder)  Patient Location: PACU  Anesthesia Type:General  Level of Consciousness: awake  Airway & Oxygen Therapy: Patient Spontanous Breathing and Patient connected to face mask oxygen  Post-op Assessment: Report given to RN  Post vital signs: Reviewed and stable  Last Vitals:  Vitals Value Taken Time  BP 74/54 01/23/21 1413  Temp    Pulse 73 01/23/21 1416  Resp 15 01/23/21 1416  SpO2 100 % 01/23/21 1416  Vitals shown include unvalidated device data.  Last Pain:  Vitals:   01/23/21 1125  TempSrc: Oral  PainSc: 0-No pain         Complications: No notable events documented.

## 2021-01-23 NOTE — Op Note (Signed)
01/23/2021  2:28 PM  PATIENT:  Margaret Warner  71 y.o. female  PRE-OPERATIVE DIAGNOSIS:  Right Shoulder Rotator Cuff Tear  POST-OPERATIVE DIAGNOSIS:  Right Shoulder Rotator Cuff Tear, subacromial impingement and AC joint arthrosis  PROCEDURE: Right shoulder arthroscopic subacromial decompression, distal clavicle excision and mini open rotator cuff repair  SURGEON:  Surgeon(s) and Role:    Thornton Park, MD - Primary  ANESTHESIA:   general and paracervical block   PREOPERATIVE INDICATIONS:  Margaret Warner is a  70 y.o. female with a diagnosis of Right Shoulder Rotator Cuff Tear who failed nonoperative treatment and elected for surgical management.    The risks benefits and alternatives were discussed with the patient preoperatively including but not limited to the risks of infection, bleeding, nerve injury, persistent pain or weakness, shoulder stiffness/arthrofibrosis, failure of the repair, re-tear of the rotator cuff and the need for further surgery. Medical risks include DVT and pulmonary embolism, myocardial infarction, stroke, pneumonia, respiratory failure and death. Patient understood these risks and wished to proceed.  OPERATIVE IMPLANTS: Capron MultiFix anchor x 1 & Smith & Nephew Q Fix anchor x 1  OPERATIVE FINDINGS: Full-thickness tear involving the supraspinatus, subacromial spur and AC joint arthrosis  OPERATIVE PROCEDURE: The patient was met in the preoperative area. The right shoulder was signed with the word yes and my initials according the hospital's correct site of surgery protocol.   A pre-op history and physical was performed at the bedside. Patient underwent an interscalene block with Exparel by the anesthesia service.  Patient was brought to the operating room where she underwent general anesthesia.  The patient was placed in a beachchair position.  A spider arm positioner was used for this case.  Examination under anesthesia revealed no  loss of passive range of motion or instability with load shift testing. The patient had a negative sulcus sign.  Patient was prepped and draped in a sterile fashion. A timeout was performed to verify the patient's name, date of birth, medical record number, correct site of surgery and correct procedure to be performed there was also used to verify the patient received antibiotics that all appropriate instruments, implants and radiographs studies were available in the room. Once all in attendance were in agreement case began.  Patient received ancef 2 grams IV for pre-op antibiotics.  Bony landmarks were drawn out with a surgical marker along with proposed arthroscopy incisions.  An 11 blade was used to establish a posterior portal through which the arthroscope was placed in the glenohumeral joint. A full diagnostic examination of the shoulder was performed.  Findings on arthroscopy are noted above.  The arthroscope was then placed in the subacromial space.  Extensive bursitis was encountered and debrided using a Dyonics flyer shaver blade and a 90 ArthroCare wand from a lateral portal which was established under direct visualization using an 18-gauge spinal needle. A subacromial decompression was performed sing a 4.5 mm bone cutter shaver blade from the lateral portal.   A distal clavicle excision was then performed through the anterior portal also using the 4.5 mm resector shaver blade.  A 4.5 mm bone cutter shaver blade was then used to debride the greater tuberosity of all torn fibers of the rotator cuff.  Three perfect Pass sutures were placed in the lateral border of the rotator cuff tear. All arthroscopic instruments were then removed and the mini-open portion of the procedure began.  A saber-type incision was made along the lateral border of the  acromion. The deltoid muscle was identified and split in line with its fibers which allowed visualization of the rotator cuff. The Perfect Pass sutures  previously placed in the lateral border of the rotator cuff were brought out through the deltoid split.  A single Smith and Nephew Q Fix anchor was placed at the articular margin of the humeral head with the greater tuberosity. The suture limbs of the Q Fix anchors were passed medially through the rotator cuff using a First Pass suture passer.   The three Perfect Pass sutures were then anchored to the greater tuberosity footprint using a single Smith & Nephew Multifix anchor. The sutures passed through the Multifix anchor were then tensioned to allow reduction of the rotator cuff to the greater tuberosity footprint. The medial row repair was then performed using the Q fix sutures, which were tied down using an arthroscopic knot tying technique.  Arthroscopic images of the repair were taken with the arthroscope both externally and from inside the glenohumeral joint. ° °All incisions were copiously irrigated. The deltoid fascia was repaired using a 0 Vicryl suture.  The subcutaneous tissue of all incisions were closed with a 2-0 Vicryl. Skin closure for the arthroscopic incisions was performed with 4-0 nylon. The skin edges of the saber incision was approximated with a running 4-0 undyed Monocryl.  A dry sterile dressing was applied.  The patient was placed in an abduction sling and a Polar Care was applied to the shoulder. ° °All sharp, sponge and it instrument counts were correct at the conclusion of the case. I was scrubbed and present for the entire case. I spoke with the patient's friend postoperatively to let her know the case had been performed without complication and the patient was stable in recovery room.  °

## 2021-01-23 NOTE — Anesthesia Procedure Notes (Signed)
Anesthesia Regional Block: Interscalene brachial plexus block   Pre-Anesthetic Checklist: , timeout performed,  Correct Patient, Correct Site, Correct Laterality,  Correct Procedure, Correct Position, site marked,  Risks and benefits discussed,  Surgical consent,  Pre-op evaluation,  At surgeon's request and post-op pain management  Laterality: Upper and Right  Prep: chloraprep       Needles:  Injection technique: Single-shot  Needle Type: Stimiplex     Needle Length: 9cm  Needle Gauge: 22     Additional Needles:   Procedures:,,,, ultrasound used (permanent image in chart),,    Narrative:  Start time: 01/23/2021 11:00 AM End time: 01/23/2021 11:06 AM Injection made incrementally with aspirations every 20 mL.  Performed by: Personally  Anesthesiologist: Iran Ouch, MD  Additional Notes: Patient consented for risk and benefits of nerve block including but not limited to nerve damage, failed block, bleeding and infection.  Patient voiced understanding.  Functioning IV was confirmed and monitors were applied.  Timeout done prior to procedure and prior to any sedation being given to the patient.  Patient confirmed procedure site prior to any sedation given to the patient.  A 10mm 22ga Stimuplex needle was used. Sterile prep,hand hygiene and sterile gloves were used.  Minimal sedation used for procedure.  No paresthesia endorsed by patient during the procedure.  Negative aspiration and negative test dose prior to incremental administration of local anesthetic. The patient tolerated the procedure well with no immediate complications.

## 2021-01-23 NOTE — Anesthesia Preprocedure Evaluation (Addendum)
Anesthesia Evaluation  Patient identified by MRN, date of birth, ID band Patient awake    Reviewed: Allergy & Precautions, NPO status , Patient's Chart, lab work & pertinent test results  History of Anesthesia Complications (+) history of anesthetic complications (awareness with oophorectomy in 1974)  Airway Mallampati: I  TM Distance: >3 FB Neck ROM: Full    Dental no notable dental hx.    Pulmonary Current Smoker (marajuana) and Patient abstained from smoking., former smoker,    Pulmonary exam normal breath sounds clear to auscultation       Cardiovascular Exercise Tolerance: Good hypertension, Pt. on medications Normal cardiovascular exam Rhythm:Regular Rate:Normal     Neuro/Psych PSYCHIATRIC DISORDERS Anxiety Depression negative neurological ROS     GI/Hepatic (+)     substance abuse  marijuana use,   Endo/Other  negative endocrine ROS  Renal/GU negative Renal ROS  negative genitourinary   Musculoskeletal  (+) Arthritis ,   Abdominal Normal abdominal exam  (+)   Peds negative pediatric ROS (+)  Hematology negative hematology ROS (+)   Anesthesia Other Findings   Reproductive/Obstetrics negative OB ROS                            Anesthesia Physical Anesthesia Plan  ASA: 2  Anesthesia Plan: General ETT   Post-op Pain Management: Regional block   Induction: Intravenous  PONV Risk Score and Plan: 2 and Ondansetron, Dexamethasone and Midazolam  Airway Management Planned: Oral ETT  Additional Equipment:   Intra-op Plan:   Post-operative Plan: Extubation in OR  Informed Consent: I have reviewed the patients History and Physical, chart, labs and discussed the procedure including the risks, benefits and alternatives for the proposed anesthesia with the patient or authorized representative who has indicated his/her understanding and acceptance.     Dental Advisory  Given  Plan Discussed with: Anesthesiologist, CRNA and Surgeon  Anesthesia Plan Comments: (Patient consented for risks of anesthesia including but not limited to:  - adverse reactions to medications - damage to eyes, teeth, lips or other oral mucosa - nerve damage due to positioning  - sore throat or hoarseness - Damage to heart, brain, nerves, lungs, other parts of body or loss of life  Patient voiced understanding.)        Anesthesia Quick Evaluation

## 2021-01-24 NOTE — Anesthesia Postprocedure Evaluation (Signed)
Anesthesia Post Note  Patient: Margaret Warner  Procedure(s) Performed: SHOULDER ARTHROSCOPY WITH OPEN ROTATOR CUFF REPAIR AND DISTAL CLAVICLE ACROMINECTOMY (Right: Shoulder) SHOULDER ARTHROSCOPY WITH SUBACROMIAL DECOMPRESSION AND DISTAL CLAVICLE EXCISION (Right: Shoulder)  Patient location during evaluation: PACU Anesthesia Type: General Level of consciousness: awake and alert Pain management: pain level controlled Vital Signs Assessment: post-procedure vital signs reviewed and stable Respiratory status: spontaneous breathing, nonlabored ventilation and respiratory function stable Cardiovascular status: blood pressure returned to baseline and stable Postop Assessment: no apparent nausea or vomiting Anesthetic complications: no   No notable events documented.   Last Vitals:  Vitals:   01/23/21 1445 01/23/21 1505  BP: (!) 102/48 (!) 124/57  Pulse: 64 65  Resp: 18 17  Temp: (!) 36.4 C 36.4 C  SpO2: 96% 99%    Last Pain:  Vitals:   01/23/21 1505  TempSrc: Temporal  PainSc: 0-No pain                 Iran Ouch

## 2021-01-25 ENCOUNTER — Encounter: Payer: Self-pay | Admitting: Orthopedic Surgery

## 2021-02-21 NOTE — H&P (Signed)
Margaret Warner is a 71 y.o. female here for TVH / BSO . Pt returns today for continued eval for PMB  And LBP . EMBX done last visit - negative  Intermittent pain for the last 6 month LLQ . + PMB started 1 month ago . PT has undergone a submucosal fibroid by me 8 yrs ago    G1P1 svd   U/s today :  Uterus retroverted   Endometrium=10.32mm   Rt ovary appears wnl Partial left ovary appears wnl No free fluid seen   Fibroid seen: mid uterus=6.6cm  EMBX 01/2021; neg   Past Medical History:  has a past medical history of Allergy, Anxiety, Arthritis, Cervical radiculitis, COPD (chronic obstructive pulmonary disease) (CMS-HCC), Depression, Environmental allergies, GERD (gastroesophageal reflux disease), Heart murmur, unspecified, History of PID, Hyperlipidemia, Hypertension, Irritable bowel syndrome, Migraine, Osteoporosis, Skin cancer, basal cell, and Tremor.  Past Surgical History:  has a past surgical history that includes S/p unilateral partial oophorectomy; Removal of cyst in spine (~2011); Dilation and curettage, diagnostic / therapeutic (06/2012); Blepharoplasty (Bilateral, 04/2015); Fracture surgery (02/24/2015); and Spine surgery (2009, I think). Family History: family history includes Cancer in her brother and father; Depression in her father; Diabetes in her father; High blood pressure (Hypertension) in her mother; Stroke in her father. Social History:  reports that she quit smoking about 13 years ago. Her smoking use included cigarettes. She started smoking about 43 years ago. She has a 60.00 pack-year smoking history. She has never used smokeless tobacco. She reports that she does not currently use alcohol. She reports that she does not use drugs. OB/GYN History:  OB History   No obstetric history on file.        Allergies: is allergic to hydrocodone, statins-hmg-coa reductase inhibitors, and tetracyclines. Medications:   Current Outpatient Medications:    ALPRAZolam (XANAX) 1 MG  tablet, Take 1 tablet (1 mg total) by mouth 2 (two) times daily as needed for Sleep, Disp: 60 tablet, Rfl: 5   ARIPiprazole (ABILIFY) 2 MG tablet, Take 1 tablet (2 mg total) by mouth once daily for 90 days, Disp: 90 tablet, Rfl: 1   atorvastatin (LIPITOR) 10 MG tablet, Take 1 tablet (10 mg total) by mouth once daily, Disp: 90 tablet, Rfl: 2   buPROPion (WELLBUTRIN XL) 300 MG XL tablet, TAKE 1 TABLET BY MOUTH EVERY DAY, Disp: 90 tablet, Rfl: 3   butalbital-acetaminophen-caffeine (FIORICET) 50-325-40 mg tablet, Take 2 tablets by mouth every 6 (six) hours as needed for Pain, Disp: 120 tablet, Rfl: 5   cholecalciferol (VITAMIN D3) 2,000 unit tablet, Take by mouth, Disp: , Rfl:    DULoxetine (CYMBALTA) 30 MG DR capsule, Take 1 capsule (30 mg total) by mouth once daily, Disp: 90 capsule, Rfl: 3   estradiol-norethindrone (COMBIPATCH) 0.05-0.14 mg/24 hr semiwkly patch, Place 1 patch onto the skin twice a week, Disp: 24 patch, Rfl: 1   fluticasone propionate (FLONASE) 50 mcg/actuation nasal spray, Place 2 sprays into both nostrils once daily, Disp: 16 g, Rfl: 11   fremanezumab-vfrm (AJOVY SYRINGE) 225 mg/1.5 mL Syrg, Inject 225 mg subcutaneously every 28 (twenty-eight) days, Disp: 1.5 mL, Rfl: 11   gabapentin (NEURONTIN) 100 MG capsule, Take 1 capsule (100 mg total) by mouth 3 (three) times daily (Patient taking differently: Take 100 mg by mouth 2 (two) times daily), Disp: 90 capsule, Rfl: 5   ibuprofen (MOTRIN) 600 MG tablet, ibuprofen 600 mg tablet, Disp: , Rfl:    irbesartan-hydrochlorothiazide (AVALIDE) 300-12.5 mg tablet, TAKE 1 TABLET BY MOUTH  EVERY DAY, Disp: 90 tablet, Rfl: 3   lidocaine HCl (ASPERCREME, LIDOCAINE, TOP), Apply topically., Disp: , Rfl:    rosuvastatin (CRESTOR) 10 MG tablet, Take 10 mg by mouth once daily, Disp: , Rfl:    SIMETHICONE (GAS-X ORAL), Take 1 tablet by mouth once daily as needed., Disp: , Rfl:    tiZANidine (ZANAFLEX) 4 MG tablet, Take 1 tablet (4 mg total) by mouth 3  (three) times daily, Disp: 270 tablet, Rfl: 1   valACYclovir (VALTREX) 1000 MG tablet, Take 1 tablet (1,000 mg total) by mouth 3 (three) times daily, Disp: 90 tablet, Rfl: 3   valACYclovir (VALTREX) 500 MG tablet, TAKE 1 TABLET (500 MG TOTAL) BY MOUTH ONCE DAILY AS NEEDED FOR FLARE, Disp: 90 tablet, Rfl: 3   Review of Systems: General:                      No fatigue or weight loss Eyes:                           No vision changes Ears:                            No hearing difficulty Respiratory:                No cough or shortness of breath Pulmonary:                  No asthma or shortness of breath Cardiovascular:           No chest pain, palpitations, dyspnea on exertion Gastrointestinal:          No abdominal bloating, chronic diarrhea, constipations, masses, pain or hematochezia Genitourinary:             No hematuria, dysuria, abnormal vaginal discharge, pelvic pain, Menometrorrhagia Lymphatic:                   No swollen lymph nodes Musculoskeletal:         No muscle weakness Neurologic:                  No extremity weakness, syncope, seizure disorder Psychiatric:                  No history of depression, delusions or suicidal/homicidal ideation      Exam:       Vitals:    02/23/21 1610  BP: (!) 142/71  Pulse: 92      Body mass index is 23.16 kg/m.   WDWN white/  female in NAD   Lungs: CTA  CV : RRR without murmur   Breast: exam done in sitting and lying position : No dimpling or retraction, no dominant mass, no spontaneous discharge, no axillary adenopathy Neck:  no thyromegaly Abdomen: soft , no mass, normal active bowel sounds,  non-tender, no rebound tenderness Pelvic: tanner stage 5 ,  External genitalia: vulva /labia no lesions Urethra: no prolapse Vagina: normal physiologic d/c, adequate room for [facility] if need be/ lavh as well Cervix: no lesions, no cervical motion tenderness   Uterus: 12 weeks normal size shape and contour, non-tender Adnexa: no mass,   non-tender     Impression:    The primary encounter diagnosis was Intramural leiomyoma of uterus. Diagnoses of PMB (postmenopausal bleeding) and Thickened endometrium were also pertinent to this visit.   Back pain maybe  related to fibroid, possibly not. Thickened stripe maybe an endometrial polyp not evaluated by embx      Plan:  Offered options of Fraction D+C and H/S vs definitive surgery TVH and BSO   she desires hysterectomy . She is aware that BSO may not be able to be accomplished .  Benefits and risks to surgery: The proposed benefit of the surgery has been discussed with the patient. The possible risks include, but are not limited to: organ injury to the bowel , bladder, ureters, and major blood vessels and nerves. There is a possibility of additional surgeries resulting from these injuries. There is also the risk of blood transfusion and the need to receive blood products during or after the procedure which may rarely lead to HIV or Hepatitis C infection. There is a risk of developing a deep venous thrombosis or a pulmonary embolism . There is the possibility of wound infection and also anesthetic complications, even the rare possibility of death. The patient understands these risks and wishes to proceed. All questions have been answered              Caroline Sauger, MD

## 2021-03-02 ENCOUNTER — Other Ambulatory Visit
Admission: RE | Admit: 2021-03-02 | Discharge: 2021-03-02 | Disposition: A | Discharge status: Home / Self Care | Payer: Medicare PPO | Source: Ambulatory Visit | Attending: Obstetrics and Gynecology | Admitting: Obstetrics and Gynecology

## 2021-03-02 ENCOUNTER — Other Ambulatory Visit: Payer: Self-pay

## 2021-03-02 HISTORY — DX: Cardiac murmur, unspecified: R01.1

## 2021-03-02 NOTE — Patient Instructions (Addendum)
Your procedure is scheduled on: Friday March 09, 2021. Report to Day Surgery inside [facility] 2nd floor, stop by admissions desk before getting on elevator.  To find out your arrival time please call [phone] between 1PM - 3PM on Thursday March 08, 2021.  Remember: Instructions that are not followed completely may result in serious medical risk,  up to and including death, or upon the discretion of your surgeon and anesthesiologist your  surgery may need to be rescheduled.     _X__ 1. Do not eat food after midnight the night before your procedure.                 No chewing gum or hard candies. You may drink clear liquids up to 2 hours                 before you are scheduled to arrive for your surgery- DO not drink clear                 liquids within 2 hours of the start of your surgery.                 Clear Liquids include:  water, apple juice without pulp, clear Gatorade, G2 or                  Gatorade Zero (avoid Red/Purple/Blue), Black Coffee or Tea (Do not add                 anything to coffee or tea).  __X__2.   Complete the "Ensure Clear Pre-surgery Clear Carbohydrate Drink" provided to you, 2 hours before arrival. **If you are diabetic you will be provided with an alternative drink, Gatorade Zero or G2.  __X__3.  On the morning of surgery brush your teeth with toothpaste and water, you                may rinse your mouth with mouthwash if you wish.  Do not swallow any toothpaste or mouthwash.     _X__ 4.  No Alcohol for 24 hours before or after surgery.   _X__ 5.  Do Not Smoke or use e-cigarettes For 24 Hours Prior to Your Surgery.                 Do not use any chewable tobacco products for at least 6 hours prior to                 Surgery.  _X__  6.  Do not use any recreational drugs (marijuana, cocaine, heroin, ecstasy, MDMA or other)                For at least one week prior to your surgery.  Combination of these drugs with anesthesia                 May have life threatening results.  ____  7.  Bring all medications with you on the day of surgery if instructed.   __X__8.  Notify your doctor if there is any change in your medical condition      (cold, fever, infections).     Do not wear jewelry, make-up, hairpins, clips or nail polish. Do not wear lotions, powders, or perfumes. You may wear deodorant. Do not shave 48 hours prior to surgery. Men may shave face and neck. Do not bring valuables to the hospital.    [facility] is not responsible for any belongings or valuables.  Contacts,  dentures or bridgework may not be worn into surgery. Leave your suitcase in the car. After surgery it may be brought to your room. For patients admitted to the hospital, discharge time is determined by your treatment team.   Patients discharged the day of surgery will not be allowed to drive home.   Make arrangements for someone to be with you for the first 24 hours of your Same Day Discharge.   __X__ Take these medicines the morning of surgery with A SIP OF WATER:    1. buPROPion (WELLBUTRIN XL) 300 MG  2. atorvastatin (LIPITOR) 10 MG  3. DULoxetine (CYMBALTA) 30 MG  4. gabapentin (NEURONTIN) 200 MG (if needed)  5.   6.   ____ Fleet Enema (as directed)   ____ Use CHG Soap (or wipes) as directed  ____ Use Benzoyl Peroxide Gel as instructed  ____ Use inhalers on the day of surgery  ____ Stop metformin 2 days prior to surgery    ____ Take 1/2 of usual insulin dose the night before surgery. No insulin the morning          of surgery.   ____ Call your PCP, cardiologist, or Pulmonologist if taking Coumadin/Plavix/aspirin and ask when to stop before your surgery.   __X__ One Week prior to surgery- Stop Anti-inflammatories such as Ibuprofen, Aleve, Advil, Motrin, meloxicam (MOBIC), diclofenac, etodolac, ketorolac, Toradol, Daypro, piroxicam, Goody's or BC powders. OK TO USE TYLENOL IF NEEDED   __X__ Stop supplements until after  surgery.    ____ Bring C-Pap to the hospital.    If you have any questions regarding your pre-procedure instructions,  Please call Pre-admit Testing at [phone]

## 2021-03-05 ENCOUNTER — Other Ambulatory Visit
Admission: RE | Admit: 2021-03-05 | Discharge: 2021-03-05 | Disposition: A | Discharge status: Home / Self Care | Payer: Medicare PPO | Source: Ambulatory Visit | Attending: Obstetrics and Gynecology | Admitting: Obstetrics and Gynecology

## 2021-03-05 ENCOUNTER — Other Ambulatory Visit: Payer: Self-pay

## 2021-03-05 DIAGNOSIS — Z01812 Encounter for preprocedural laboratory examination: Secondary | ICD-10-CM | POA: Diagnosis present

## 2021-03-05 DIAGNOSIS — Z01818 Encounter for other preprocedural examination: Secondary | ICD-10-CM

## 2021-03-05 LAB — BASIC METABOLIC PANEL
Anion gap: 9 (ref 5–15)
BUN: 20 mg/dL (ref 8–23)
CO2: 30 mmol/L (ref 22–32)
Calcium: 9.4 mg/dL (ref 8.9–10.3)
Chloride: 99 mmol/L (ref 98–111)
Creatinine, Ser: 0.72 mg/dL (ref 0.44–1.00)
GFR, Estimated: >60 mL/min (ref >60)
Glucose, Bld: 92 mg/dL (ref 70–99)
Potassium: 3.4 mmol/L — ABNORMAL LOW (ref 3.5–5.1)
Sodium: 138 mmol/L (ref 135–145)

## 2021-03-05 LAB — TYPE AND SCREEN
ABO/RH(D): A POS
Antibody Screen: NEGATIVE

## 2021-03-05 LAB — CBC
HCT: 38.2 % (ref 36.0–46.0)
Hemoglobin: 12.1 g/dL (ref 12.0–15.0)
MCH: 28.1 pg (ref 26.0–34.0)
MCHC: 31.7 g/dL (ref 30.0–36.0)
MCV: 88.6 fL (ref 80.0–100.0)
Platelets: 342 10*3/uL (ref 150–400)
RBC: 4.31 MIL/uL (ref 3.87–5.11)
RDW: 15.2 % (ref 11.5–15.5)
WBC: 6.7 10*3/uL (ref 4.0–10.5)
nRBC: 0 % (ref 0.0–0.2)

## 2021-03-09 ENCOUNTER — Ambulatory Visit: Payer: Medicare PPO | Admitting: Urgent Care

## 2021-03-09 ENCOUNTER — Encounter: Payer: Self-pay | Admitting: Obstetrics and Gynecology

## 2021-03-09 ENCOUNTER — Other Ambulatory Visit: Payer: Self-pay

## 2021-03-09 ENCOUNTER — Ambulatory Visit: Payer: Medicare PPO | Admitting: Registered Nurse

## 2021-03-09 ENCOUNTER — Ambulatory Visit
Admission: RE | Admit: 2021-03-09 | Discharge: 2021-03-09 | Disposition: A | Discharge status: Home / Self Care | Payer: Medicare PPO | Attending: Obstetrics and Gynecology | Admitting: Obstetrics and Gynecology

## 2021-03-09 ENCOUNTER — Encounter
Admission: RE | Disposition: A | Discharge status: Home / Self Care | Payer: Self-pay | Source: Home / Self Care | Attending: Obstetrics and Gynecology

## 2021-03-09 DIAGNOSIS — I10 Essential (primary) hypertension: Secondary | ICD-10-CM | POA: Diagnosis not present

## 2021-03-09 DIAGNOSIS — K219 Gastro-esophageal reflux disease without esophagitis: Secondary | ICD-10-CM | POA: Insufficient documentation

## 2021-03-09 DIAGNOSIS — M1990 Unspecified osteoarthritis, unspecified site: Secondary | ICD-10-CM | POA: Diagnosis not present

## 2021-03-09 DIAGNOSIS — D251 Intramural leiomyoma of uterus: Secondary | ICD-10-CM | POA: Diagnosis present

## 2021-03-09 DIAGNOSIS — N950 Postmenopausal bleeding: Secondary | ICD-10-CM | POA: Diagnosis not present

## 2021-03-09 DIAGNOSIS — Z01818 Encounter for other preprocedural examination: Secondary | ICD-10-CM

## 2021-03-09 DIAGNOSIS — Z87891 Personal history of nicotine dependence: Secondary | ICD-10-CM | POA: Diagnosis not present

## 2021-03-09 DIAGNOSIS — R9389 Abnormal findings on diagnostic imaging of other specified body structures: Secondary | ICD-10-CM | POA: Diagnosis not present

## 2021-03-09 HISTORY — PX: SALPINGOOPHORECTOMY: SHX82

## 2021-03-09 HISTORY — PX: VAGINAL HYSTERECTOMY: SHX2639

## 2021-03-09 SURGERY — HYSTERECTOMY, VAGINAL
Anesthesia: General

## 2021-03-09 MED ORDER — ONDANSETRON HCL 4 MG PO TABS: 8.0000 mg | ORAL_TABLET | Freq: Once | ORAL | Status: DC

## 2021-03-09 MED ORDER — ROCURONIUM BROMIDE 10 MG/ML (PF) SYRINGE
PREFILLED_SYRINGE | INTRAVENOUS | Status: AC
  Filled 2021-03-09: qty 10

## 2021-03-09 MED ORDER — FENTANYL CITRATE (PF) 100 MCG/2ML IJ SOLN
INTRAMUSCULAR | Status: AC
  Administered 2021-03-09: 50 ug via INTRAVENOUS
  Filled 2021-03-09: qty 2

## 2021-03-09 MED ORDER — PHENYLEPHRINE 40 MCG/ML (10ML) SYRINGE FOR IV PUSH (FOR BLOOD PRESSURE SUPPORT)
PREFILLED_SYRINGE | INTRAVENOUS | Status: AC
  Filled 2021-03-09: qty 10

## 2021-03-09 MED ORDER — GABAPENTIN 300 MG PO CAPS
300.0000 mg | ORAL_CAPSULE | ORAL | Status: AC
  Administered 2021-03-09: 300 mg via ORAL

## 2021-03-09 MED ORDER — ONDANSETRON HCL 4 MG/2ML IJ SOLN
INTRAMUSCULAR | Status: AC
  Filled 2021-03-09: qty 2

## 2021-03-09 MED ORDER — FENTANYL CITRATE (PF) 100 MCG/2ML IJ SOLN
INTRAMUSCULAR | Status: AC
  Filled 2021-03-09: qty 2

## 2021-03-09 MED ORDER — GABAPENTIN 300 MG PO CAPS
ORAL_CAPSULE | ORAL | Status: AC
  Filled 2021-03-09: qty 1

## 2021-03-09 MED ORDER — LIDOCAINE-EPINEPHRINE 1 %-1:100000 IJ SOLN
INTRAMUSCULAR | Status: DC | PRN
  Administered 2021-03-09: 8 mL

## 2021-03-09 MED ORDER — ACETAMINOPHEN 500 MG PO TABS
1000.0000 mg | ORAL_TABLET | ORAL | Status: AC
  Administered 2021-03-09: 1000 mg via ORAL

## 2021-03-09 MED ORDER — LIDOCAINE HCL (PF) 2 % IJ SOLN
INTRAMUSCULAR | Status: AC
  Filled 2021-03-09: qty 5

## 2021-03-09 MED ORDER — GLYCOPYRROLATE 0.2 MG/ML IJ SOLN
INTRAMUSCULAR | Status: AC
  Filled 2021-03-09: qty 1

## 2021-03-09 MED ORDER — LACTATED RINGERS IV SOLN: INTRAVENOUS | Status: DC

## 2021-03-09 MED ORDER — PROPOFOL 10 MG/ML IV BOLUS
INTRAVENOUS | Status: DC | PRN
Start: 2021-03-09 — End: 2021-03-09
  Administered 2021-03-09: 100 mg via INTRAVENOUS

## 2021-03-09 MED ORDER — EPHEDRINE 5 MG/ML INJ
INTRAVENOUS | Status: AC
  Filled 2021-03-09: qty 5

## 2021-03-09 MED ORDER — MIDAZOLAM HCL 2 MG/2ML IJ SOLN
INTRAMUSCULAR | Status: DC | PRN
Start: 2021-03-09 — End: 2021-03-09
  Administered 2021-03-09: 2 mg via INTRAVENOUS

## 2021-03-09 MED ORDER — EPHEDRINE SULFATE (PRESSORS) 50 MG/ML IJ SOLN
INTRAMUSCULAR | Status: DC | PRN
  Administered 2021-03-09: 10 mg via INTRAVENOUS
  Administered 2021-03-09 (×3): 5 mg via INTRAVENOUS

## 2021-03-09 MED ORDER — ORAL CARE MOUTH RINSE: 15.0000 mL | Freq: Once | OROMUCOSAL | Status: AC

## 2021-03-09 MED ORDER — KETAMINE HCL 50 MG/5ML IJ SOSY
PREFILLED_SYRINGE | INTRAMUSCULAR | Status: AC
  Filled 2021-03-09: qty 5

## 2021-03-09 MED ORDER — OXYCODONE-ACETAMINOPHEN 5-325 MG PO TABS: 1.0000 | ORAL_TABLET | Freq: Once | ORAL | Status: DC

## 2021-03-09 MED ORDER — FENTANYL CITRATE (PF) 100 MCG/2ML IJ SOLN
INTRAMUSCULAR | Status: DC | PRN
  Administered 2021-03-09: 37.5 ug via INTRAVENOUS
  Administered 2021-03-09: 12.5 ug via INTRAVENOUS
  Administered 2021-03-09: 25 ug via INTRAVENOUS

## 2021-03-09 MED ORDER — ONDANSETRON HCL 4 MG/2ML IJ SOLN
INTRAMUSCULAR | Status: DC | PRN
  Administered 2021-03-09: 4 mg via INTRAVENOUS

## 2021-03-09 MED ORDER — LIDOCAINE-EPINEPHRINE 1 %-1:100000 IJ SOLN
INTRAMUSCULAR | Status: AC
  Filled 2021-03-09: qty 1

## 2021-03-09 MED ORDER — CHLORHEXIDINE GLUCONATE 0.12 % MT SOLN
OROMUCOSAL | Status: AC
  Filled 2021-03-09: qty 15

## 2021-03-09 MED ORDER — FAMOTIDINE 20 MG PO TABS
ORAL_TABLET | ORAL | Status: AC
  Filled 2021-03-09: qty 1

## 2021-03-09 MED ORDER — CEFAZOLIN SODIUM-DEXTROSE 2-4 GM/100ML-% IV SOLN
2.0000 g | Freq: Once | INTRAVENOUS | Status: AC
Start: 2021-03-09 — End: 2021-03-09
  Administered 2021-03-09: 2 g via INTRAVENOUS

## 2021-03-09 MED ORDER — CEFAZOLIN SODIUM-DEXTROSE 2-4 GM/100ML-% IV SOLN
INTRAVENOUS | Status: AC
  Filled 2021-03-09: qty 100

## 2021-03-09 MED ORDER — PROPOFOL 10 MG/ML IV BOLUS
INTRAVENOUS | Status: AC
  Filled 2021-03-09: qty 20

## 2021-03-09 MED ORDER — MIDAZOLAM HCL 2 MG/2ML IJ SOLN
INTRAMUSCULAR | Status: AC
  Filled 2021-03-09: qty 2

## 2021-03-09 MED ORDER — SUGAMMADEX SODIUM 200 MG/2ML IV SOLN
INTRAVENOUS | Status: DC | PRN
Start: 2021-03-09 — End: 2021-03-09
  Administered 2021-03-09: 200 mg via INTRAVENOUS

## 2021-03-09 MED ORDER — ACETAMINOPHEN 500 MG PO TABS
ORAL_TABLET | ORAL | Status: DC
Start: 2021-03-09 — End: 2021-03-09
  Filled 2021-03-09: qty 2

## 2021-03-09 MED ORDER — ROCURONIUM BROMIDE 100 MG/10ML IV SOLN
INTRAVENOUS | Status: DC | PRN
Start: 2021-03-09 — End: 2021-03-09
  Administered 2021-03-09: 50 mg via INTRAVENOUS
  Administered 2021-03-09: 20 mg via INTRAVENOUS

## 2021-03-09 MED ORDER — DEXAMETHASONE SODIUM PHOSPHATE 10 MG/ML IJ SOLN
INTRAMUSCULAR | Status: AC
  Filled 2021-03-09: qty 1

## 2021-03-09 MED ORDER — DEXAMETHASONE SODIUM PHOSPHATE 10 MG/ML IJ SOLN
INTRAMUSCULAR | Status: DC | PRN
  Administered 2021-03-09: 5 mg via INTRAVENOUS

## 2021-03-09 MED ORDER — LIDOCAINE HCL (CARDIAC) PF 100 MG/5ML IV SOSY
PREFILLED_SYRINGE | INTRAVENOUS | Status: DC | PRN
Start: 2021-03-09 — End: 2021-03-09
  Administered 2021-03-09: 20 mg via INTRAVENOUS
  Administered 2021-03-09: 80 mg via INTRAVENOUS

## 2021-03-09 MED ORDER — PHENYLEPHRINE HCL (PRESSORS) 10 MG/ML IV SOLN
INTRAVENOUS | Status: DC | PRN
  Administered 2021-03-09: 80 ug via INTRAVENOUS
  Administered 2021-03-09 (×2): 120 ug via INTRAVENOUS
  Administered 2021-03-09: 80 ug via INTRAVENOUS

## 2021-03-09 MED ORDER — CHLORHEXIDINE GLUCONATE 0.12 % MT SOLN
15.0000 mL | Freq: Once | OROMUCOSAL | Status: AC
  Administered 2021-03-09: 15 mL via OROMUCOSAL

## 2021-03-09 MED ORDER — PHENYLEPHRINE HCL-NACL 20-0.9 MG/250ML-% IV SOLN
INTRAVENOUS | Status: DC | PRN
  Administered 2021-03-09: 25 ug/min via INTRAVENOUS

## 2021-03-09 MED ORDER — KETOROLAC TROMETHAMINE 30 MG/ML IJ SOLN
INTRAMUSCULAR | Status: AC
  Filled 2021-03-09: qty 1

## 2021-03-09 MED ORDER — ONDANSETRON HCL 4 MG/2ML IJ SOLN: 4.0000 mg | Freq: Once | INTRAMUSCULAR | Status: DC | PRN

## 2021-03-09 MED ORDER — KETOROLAC TROMETHAMINE 30 MG/ML IJ SOLN
INTRAMUSCULAR | Status: DC | PRN
  Administered 2021-03-09: 15 mg via INTRAVENOUS

## 2021-03-09 MED ORDER — GLYCOPYRROLATE 0.2 MG/ML IJ SOLN
INTRAMUSCULAR | Status: DC | PRN
  Administered 2021-03-09: .2 mg via INTRAVENOUS

## 2021-03-09 MED ORDER — POVIDONE-IODINE 10 % EX SWAB: 2.0000 "application " | Freq: Once | CUTANEOUS | Status: DC

## 2021-03-09 MED ORDER — KETAMINE HCL 10 MG/ML IJ SOLN
INTRAMUSCULAR | Status: DC | PRN
  Administered 2021-03-09: 10 mg via INTRAVENOUS
  Administered 2021-03-09: 20 mg via INTRAVENOUS

## 2021-03-09 MED ORDER — FENTANYL CITRATE (PF) 100 MCG/2ML IJ SOLN
25.0000 ug | INTRAMUSCULAR | Status: DC | PRN
  Administered 2021-03-09: 25 ug via INTRAVENOUS

## 2021-03-09 MED ORDER — FAMOTIDINE 20 MG PO TABS
20.0000 mg | ORAL_TABLET | Freq: Once | ORAL | Status: AC
  Administered 2021-03-09: 20 mg via ORAL

## 2021-03-09 SURGICAL SUPPLY — 41 items
BACTOSHIELD CHG 4% 4OZ (MISCELLANEOUS) ×1
BAG URINE DRAIN 2000ML AR STRL (UROLOGICAL SUPPLIES) ×3 IMPLANT
CATH FOLEY 2WAY  5CC 16FR (CATHETERS) ×1
CATH ROBINSON RED A/P 16FR (CATHETERS) ×2 IMPLANT
CATH URTH 16FR FL 2W BLN LF (CATHETERS) ×2 IMPLANT
DRAPE PERI LITHO V/GYN (MISCELLANEOUS) ×3 IMPLANT
DRAPE SURG 17X11 SM STRL (DRAPES) ×3 IMPLANT
DRAPE UNDER BUTTOCK W/FLU (DRAPES) ×3 IMPLANT
ELECT REM PT RETURN 9FT ADLT (ELECTROSURGICAL) ×3
ELECTRODE REM PT RTRN 9FT ADLT (ELECTROSURGICAL) ×2 IMPLANT
GAUZE 4X4 16PLY ~~LOC~~+RFID DBL (SPONGE) ×6 IMPLANT
GLOVE SURG SYN 8.0 (GLOVE) ×3 IMPLANT
GLOVE SURG SYN 8.0 PF PI (GLOVE) ×2 IMPLANT
GOWN STRL REUS W/ TWL LRG LVL3 (GOWN DISPOSABLE) ×6 IMPLANT
GOWN STRL REUS W/ TWL XL LVL3 (GOWN DISPOSABLE) ×2 IMPLANT
GOWN STRL REUS W/TWL LRG LVL3 (GOWN DISPOSABLE) ×3
GOWN STRL REUS W/TWL XL LVL3 (GOWN DISPOSABLE) ×1
KIT PINK PAD W/HEAD ARE REST (MISCELLANEOUS) ×3
KIT PINK PAD W/HEAD ARM REST (MISCELLANEOUS) ×2 IMPLANT
KIT TURNOVER CYSTO (KITS) ×3 IMPLANT
LABEL OR SOLS (LABEL) ×3 IMPLANT
MANIFOLD NEPTUNE II (INSTRUMENTS) ×3 IMPLANT
NEEDLE HYPO 22GX1.5 SAFETY (NEEDLE) ×3 IMPLANT
PACK BASIN MINOR ARMC (MISCELLANEOUS) ×3 IMPLANT
PAD OB MATERNITY 4.3X12.25 (PERSONAL CARE ITEMS) ×3 IMPLANT
PAD PREP 24X41 OB/GYN DISP (PERSONAL CARE ITEMS) ×3 IMPLANT
SCRUB CHG 4% DYNA-HEX 4OZ (MISCELLANEOUS) ×2 IMPLANT
SOL PREP PROV IODINE SCRUB 4OZ (MISCELLANEOUS) ×3 IMPLANT
SOL PREP PVP 2OZ (MISCELLANEOUS) ×3
SOLUTION PREP PVP 2OZ (MISCELLANEOUS) ×2 IMPLANT
SURGILUBE 2OZ TUBE FLIPTOP (MISCELLANEOUS) ×3 IMPLANT
SUT PDS 2-0 27IN (SUTURE) IMPLANT
SUT VIC AB 0 CT1 27 (SUTURE) ×3
SUT VIC AB 0 CT1 27XCR 8 STRN (SUTURE) ×4 IMPLANT
SUT VIC AB 0 CT1 36 (SUTURE) ×3 IMPLANT
SUT VIC AB 2-0 SH 27 (SUTURE) ×2
SUT VIC AB 2-0 SH 27XBRD (SUTURE) ×2 IMPLANT
SYR 10ML LL (SYRINGE) ×3 IMPLANT
SYR CONTROL 10ML LL (SYRINGE) ×3 IMPLANT
WATER STERILE IRR 1000ML POUR (IV SOLUTION) ×3 IMPLANT
WATER STERILE IRR 500ML POUR (IV SOLUTION) ×3 IMPLANT

## 2021-03-09 NOTE — Progress Notes (Signed)
Patient attempted to void. Unable to at this time. Patient given coke and peanut butter crackers. Fluids hooked back up to patient. Will try to void again.  ?

## 2021-03-09 NOTE — Anesthesia Procedure Notes (Signed)
Procedure Name: Intubation ?Date/Time: 03/09/2021 10:15 AM ?Performed by: Lia Foyer, CRNA ?Pre-anesthesia Checklist: Patient identified, Emergency Drugs available, Suction available and Patient being monitored ?Patient Re-evaluated:Patient Re-evaluated prior to induction ?Oxygen Delivery Method: Circle system utilized ?Preoxygenation: Pre-oxygenation with 100% oxygen ?Induction Type: IV induction ?Ventilation: Mask ventilation without difficulty ?Laryngoscope Size: McGraph and 3 ?Grade View: Grade I ?Tube type: Oral ?Number of attempts: 1 ?Airway Equipment and Method: Stylet and Video-laryngoscopy ?Placement Confirmation: ETT inserted through vocal cords under direct vision, positive ETCO2 and breath sounds checked- equal and bilateral ?Secured at: 19 cm ?Tube secured with: Tape ?Dental Injury: Teeth and Oropharynx as per pre-operative assessment  ? ? ? ? ?

## 2021-03-09 NOTE — Transfer of Care (Signed)
Immediate Anesthesia Transfer of Care Note ? ?Patient: Margaret Warner ? ?Procedure(s) Performed: HYSTERECTOMY VAGINAL ?SALPINGO OOPHORECTOMY (Bilateral) ? ?Patient Location: PACU ? ?Anesthesia Type:General ? ?Level of Consciousness: drowsy ? ?Airway & Oxygen Therapy: Patient Spontanous Breathing and Patient connected to face mask oxygen ? ?Post-op Assessment: Report given to RN and Post -op Vital signs reviewed and stable ? ?Post vital signs: Reviewed and stable ? ?Last Vitals:  ?Vitals Value Taken Time  ?BP 118/55 03/09/21 1150  ?Temp 36.7 ?C 03/09/21 1150  ?Pulse 75 03/09/21 1153  ?Resp 15 03/09/21 1153  ?SpO2 100 % 03/09/21 1153  ?Vitals shown include unvalidated device data. ? ?Last Pain:  ?Vitals:  ? 03/09/21 1150  ?TempSrc:   ?PainSc: Asleep  ?   ? ?  ? ?Complications: No notable events documented. ?

## 2021-03-09 NOTE — Progress Notes (Signed)
Pt ready for TVH / bso . All questions answered . LAbs reviewed . Proceed  ?

## 2021-03-09 NOTE — OR Nursing (Addendum)
Intake 480 po; 50 IV.  Output 0.   Bladder scan 114.  MD notified of same; advises ok to d/c to home with self cath kit - pt feels comfortable with same, advises she has had to assist family member in past with same. ? ?Update  1520 - voided 100cc BR - self cath kit not sent home with pt. ?

## 2021-03-09 NOTE — Anesthesia Preprocedure Evaluation (Signed)
Anesthesia Evaluation  ?Patient identified by MRN, date of birth, ID band ?Patient awake ? ? ? ?Reviewed: ?Allergy & Precautions, NPO status , Patient's Chart, lab work & pertinent test results ? ?History of Anesthesia Complications ?(+) history of anesthetic complications (awareness with oophorectomy in 1974) ? ?Airway ?Mallampati: I ? ?TM Distance: >3 FB ?Neck ROM: Full ? ? ? Dental ? ?(+) Dental Advidsory Given, Implants, Teeth Intact, Missing ?  ?Pulmonary ?neg shortness of breath, neg COPD, neg recent URI, Current Smoker (marajuana) and Patient abstained from smoking., former smoker,  ?  ?Pulmonary exam normal ?breath sounds clear to auscultation ? ? ? ? ? ? Cardiovascular ?Exercise Tolerance: Good ?hypertension, Pt. on medications ?(-) angina(-) Past MI and (-) Cardiac Stents Normal cardiovascular exam(-) dysrhythmias + Valvular Problems/Murmurs  ?Rhythm:Regular Rate:Normal ? ? ?  ?Neuro/Psych ?PSYCHIATRIC DISORDERS Anxiety Depression negative neurological ROS ?   ? GI/Hepatic ?negative GI ROS, (+)  ?  ? substance abuse ? marijuana use,   ?Endo/Other  ?negative endocrine ROS ? Renal/GU ?negative Renal ROS  ?negative genitourinary ?  ?Musculoskeletal ? ?(+) Arthritis ,  ? Abdominal ?Normal abdominal exam  (+)   ?Peds ?negative pediatric ROS ?(+)  Hematology ?negative hematology ROS ?(+)   ?Anesthesia Other Findings ?Past Medical History: ?No date: Anemia ?No date: Anxiety ?No date: Arthritis ?    Comment:  HANDS, BACK AND KNEES ?No date: Complication of anesthesia ?    Comment:  WITH OOPHORECTOMY ?No date: Depression ?No date: GERD (gastroesophageal reflux disease) ?    Comment:  RARE-NO MEDS ?No date: Headache ?No date: Heart murmur ?No date: Hypertension ?No date: Skin cancer ?    Comment:  BASAL AND SQUAMOUS CELL ?No date: Uterine fibroid ? ? Reproductive/Obstetrics ?negative OB ROS ? ?  ? ? ? ? ? ? ? ? ? ? ? ? ? ?  ?  ? ? ? ? ? ? ? ? ?Anesthesia Physical ? ?Anesthesia  Plan ? ?ASA: 2 ? ?Anesthesia Plan: General  ? ?Post-op Pain Management:   ? ?Induction: Intravenous ? ?PONV Risk Score and Plan: 2 and Ondansetron, Dexamethasone and Midazolam ? ?Airway Management Planned: Oral ETT ? ?Additional Equipment:  ? ?Intra-op Plan:  ? ?Post-operative Plan: Extubation in OR ? ?Informed Consent: I have reviewed the patients History and Physical, chart, labs and discussed the procedure including the risks, benefits and alternatives for the proposed anesthesia with the patient or authorized representative who has indicated his/her understanding and acceptance.  ? ? ? ?Dental Advisory Given ? ?Plan Discussed with: Anesthesiologist, CRNA and Surgeon ? ?Anesthesia Plan Comments: (Patient consented for risks of anesthesia including but not limited to:  ?- adverse reactions to medications ?- damage to eyes, teeth, lips or other oral mucosa ?- nerve damage due to positioning  ?- sore throat or hoarseness ?- Damage to heart, brain, nerves, lungs, other parts of body or loss of life ? ?Patient voiced understanding.)  ? ? ? ? ? ? ?Anesthesia Quick Evaluation ? ?

## 2021-03-09 NOTE — Discharge Instructions (Addendum)
AMBULATORY SURGERY  ?DISCHARGE INSTRUCTIONS ? ? ?The drugs that you were given will stay in your system until tomorrow so for the next 24 hours you should not: ? ?Drive an automobile ?Make any legal decisions ?Drink any alcoholic beverage ? ? ?You may resume regular meals tomorrow.  Today it is better to start with liquids and gradually work up to solid foods. ? ?You may eat anything you prefer, but it is better to start with liquids, then soup and crackers, and gradually work up to solid foods. ? ? ?Please notify your doctor immediately if you have any unusual bleeding, trouble breathing, redness and pain at the surgery site, drainage, fever, or pain not relieved by medication. ? ? ? ?Additional Instructions:   zofran, percocet, ibuprofen as previously prescribed (meds already at home per pt) ? ? ? ? ? ? ? ?Please contact your physician with any problems or Same Day Surgery at [phone], Monday through Friday 6 am to 4 pm, or [place] [facility] number at [phone].  ?

## 2021-03-09 NOTE — Brief Op Note (Signed)
03/09/2021 ? ?11:44 AM ? ?PATIENT:  Margaret Warner  71 y.o. female ? ?PRE-OPERATIVE DIAGNOSIS:  postmenopausal bleeding, symptomatic fibroid uterus ? ?POST-OPERATIVE DIAGNOSIS:  postmenopausal bleeding, symptomatic fibroid uterus ? ?PROCEDURE:  Procedure(s): ?HYSTERECTOMY VAGINAL (N/A) ?SALPINGO OOPHORECTOMY (Bilateral) ? ?SURGEON:  Surgeon(s) and Role: ?   * Demetrie Borge, Gwen Her, MD - Primary ?   Will Bonnet, MD - Assisting ? ?PHYSICIAN ASSISTANT: cst ? ?ASSISTANTS: none  ? ?ANESTHESIA:   general ? ?EBL:  75 mL  IOF 800cc  UO 100 cc ? ?BLOOD ADMINISTERED:none ? ?DRAINS: none  ? ?LOCAL MEDICATIONS USED:  LIDOCAINE  ? ?SPECIMEN:  Source of Specimen:  cx , utx , bilateral fallopian tube and ovaries  ? ?DISPOSITION OF SPECIMEN:  PATHOLOGY ? ?COUNTS:  yes ?TOURNIQUET:  * No tourniquets in log * ? ?DICTATION: .Other Dictation: Dictation Number verbal  ? ?PLAN OF CARE: Discharge to home after PACU ? ?PATIENT DISPOSITION:  PACU - hemodynamically stable. ?  ?Delay start of Pharmacological VTE agent (>24hrs) due to surgical blood loss or risk of bleeding: not applicable ? ?

## 2021-03-10 NOTE — Anesthesia Postprocedure Evaluation (Signed)
Anesthesia Post Note ? ?Patient: Margaret Warner ? ?Procedure(s) Performed: HYSTERECTOMY VAGINAL ?SALPINGO OOPHORECTOMY (Bilateral) ? ?Patient location during evaluation: PACU ?Anesthesia Type: General ?Level of consciousness: awake and alert ?Pain management: pain level controlled ?Vital Signs Assessment: post-procedure vital signs reviewed and stable ?Respiratory status: spontaneous breathing, nonlabored ventilation, respiratory function stable and patient connected to nasal cannula oxygen ?Cardiovascular status: blood pressure returned to baseline and stable ?Postop Assessment: no apparent nausea or vomiting ?Anesthetic complications: no ? ? ?No notable events documented. ? ? ?Last Vitals:  ?Vitals:  ? 03/09/21 1323 03/09/21 1504  ?BP: 122/76 136/62  ?Pulse: 70 64  ?Resp: 13 16  ?Temp: 36.5 ?C (!) 36.2 ?C  ?SpO2: 100% 100%  ?  ?Last Pain:  ?Vitals:  ? 03/09/21 1504  ?TempSrc: Temporal  ?PainSc: 2   ? ? ?  ?  ?  ?  ?  ?  ? ?Martha Clan ? ? ? ? ?

## 2021-03-11 ENCOUNTER — Encounter: Payer: Self-pay | Admitting: Obstetrics and Gynecology

## 2021-03-12 LAB — SURGICAL PATHOLOGY

## 2021-03-12 NOTE — Op Note (Signed)
NAME: Margaret Warner, Margaret Warner. ?MEDICAL RECORD NO: [id number] ?ACCOUNT NO: [id number] ?DATE OF BIRTH: 1950/04/08 ?FACILITY: [facility] ?LOCATION: [facility] ?PHYSICIAN: Boykin Nearing, MD ? ?Operative Report  ? ?DATE OF PROCEDURE: 03/09/2021 ? ?PREOPERATIVE DIAGNOSIS:  Symptomatic fibroid uterus. ? ?POSTOPERATIVE DIAGNOSIS:  Symptomatic fibroid uterus. ? ?PROCEDURE:  Total vaginal hysterectomy, bilateral salpingo-oophorectomy. ? ?SURGEON:  Boykin Nearing, MD. ? ?FIRST ASSISTANT:  Donneta Romberg, MD ? ?ANESTHESIA:  General endotracheal anesthesia. ? ?INDICATIONS:  A 71 year old gravida 1, para 1 patient with pelvic pressure and pain for several years.  Her ultrasound demonstrated a 6.6 cm central uterine fibroid.  The patient elects for definitive surgery. ? ?DESCRIPTION OF PROCEDURE:  After adequate general endotracheal anesthesia, the patient was placed in dorsal supine position with the legs in the candy cane stirrups.  The patient's abdomen, perineum and vagina were prepped and draped in normal sterile  ?fashion.  The patient did receive 2 grams of IV Ancef for surgical prophylaxis.  Timeout was performed.  A weighted speculum was placed in the posterior vaginal vault and the bladder was drained, yielding 75 mL of clear urine.  The cervix was grasped  ?with 2 thyroid tenaculum.  Cervix was circumferentially injected with 1% lidocaine with 1:100,000 epinephrine.  A direct posterior colpotomy incision was made upon entry into the posterior cul-de-sac.  Long-billed speculum was placed.  The uterosacral  ?ligaments were bilaterally clamped, transected and suture ligated and tagged for later identification.  The anterior cervix was opened with the Bovie.  The cardinal ligaments were then bilaterally clamped, transected and suture ligated with 0 Vicryl  ?suture.  The anterior cul-de-sac was entered sharply and the Deaver retractor was replaced within to elevate the bladder anteriorly.  Uterine arteries were  bilaterally clamped, transected and suture ligated with 0 Vicryl suture.  Two additional clamps on ? each side until the cornua were bilaterally clamped, transected.  Given the girth of the uterus, the cervix and central portion of the uterus was scored out followed by extraction of the large central fibroid.  The cornual clamps were then transected  ?and doubly ligated with 0 Vicryl suture.  The fallopian tubes and ovaries on each side were clamped with a Heaney clamp, transected, and suture ligated.  There was a small amount of oozing on the left side that required additional suturing for good  ?hemostasis.  The peritoneum was then closed with a 2-0 PDS suture in a pursestring fashion and the vaginal cuff was closed vertically with 0 Vicryl suture running nonlocking. Good approximation of tissues.  Uterosacral ligaments were bilaterally plicated ? centrally and the rest of the vault was closed with the 0 Vicryl suture.  Good hemostasis was noted.  No complications. ? ?ESTIMATED BLOOD LOSS:  75 mL ? ?Straight catheterization of the bladder yielded additional 25 mL at the end of the case, totaling 100 mL of urine output. ? ?INTRAOPERATIVE FLUIDS:  800 mL ? ?The patient did receive 15 mg intravenous Toradol at the end of the case and was taken to recovery room in good condition. ? ? ? ? ? ?PAA ?D: 03/09/2021 12:32:05 pm T: 03/09/2021 10:23:00 pm  ?JOB: [id number]/ [id number]  ?

## 2021-07-13 LAB — EXTERNAL GENERIC LAB PROCEDURE: COLOGUARD: NEGATIVE

## 2021-07-13 LAB — COLOGUARD: COLOGUARD: NEGATIVE

## 2021-12-11 ENCOUNTER — Other Ambulatory Visit: Payer: Self-pay

## 2021-12-11 DIAGNOSIS — Z1231 Encounter for screening mammogram for malignant neoplasm of breast: Secondary | ICD-10-CM

## 2022-02-04 ENCOUNTER — Ambulatory Visit
Admission: RE | Admit: 2022-02-04 | Discharge: 2022-02-04 | Disposition: A | Discharge status: Home / Self Care | Payer: Medicare PPO | Source: Ambulatory Visit | Attending: Internal Medicine | Admitting: Internal Medicine

## 2022-02-04 DIAGNOSIS — Z1231 Encounter for screening mammogram for malignant neoplasm of breast: Secondary | ICD-10-CM | POA: Diagnosis present

## 2023-01-06 ENCOUNTER — Other Ambulatory Visit: Payer: Self-pay | Admitting: Internal Medicine

## 2023-01-06 DIAGNOSIS — Z1231 Encounter for screening mammogram for malignant neoplasm of breast: Secondary | ICD-10-CM

## 2023-02-19 ENCOUNTER — Ambulatory Visit
Admission: RE | Admit: 2023-02-19 | Discharge: 2023-02-19 | Disposition: A | Discharge status: Home / Self Care | Payer: Medicare PPO | Source: Ambulatory Visit | Attending: Internal Medicine | Admitting: Internal Medicine

## 2023-02-19 DIAGNOSIS — Z1231 Encounter for screening mammogram for malignant neoplasm of breast: Secondary | ICD-10-CM | POA: Insufficient documentation

## 2023-07-15 ENCOUNTER — Other Ambulatory Visit: Payer: Self-pay | Admitting: Internal Medicine

## 2023-07-15 DIAGNOSIS — Z0000 Encounter for general adult medical examination without abnormal findings: Secondary | ICD-10-CM

## 2023-07-15 DIAGNOSIS — E782 Mixed hyperlipidemia: Secondary | ICD-10-CM

## 2023-07-16 ENCOUNTER — Encounter: Payer: Self-pay | Admitting: Internal Medicine

## 2023-07-23 ENCOUNTER — Ambulatory Visit
Admission: RE | Admit: 2023-07-23 | Discharge: 2023-07-23 | Disposition: A | Discharge status: Home / Self Care | Payer: Self-pay | Source: Ambulatory Visit | Attending: Internal Medicine | Admitting: Internal Medicine

## 2023-07-23 DIAGNOSIS — E782 Mixed hyperlipidemia: Secondary | ICD-10-CM | POA: Insufficient documentation

## 2023-07-23 DIAGNOSIS — Z0000 Encounter for general adult medical examination without abnormal findings: Secondary | ICD-10-CM | POA: Insufficient documentation
